# Patient Record
Sex: Female | Born: 1960 | Hispanic: Yes | Marital: Single | Smoking: Never smoker
Health system: Southern US, Community
[De-identification: ages and names within clinical notes are randomized; demographics above are authoritative.]

## PROBLEM LIST (undated history)

## (undated) DIAGNOSIS — K760 Fatty (change of) liver, not elsewhere classified: Secondary | ICD-10-CM

## (undated) HISTORY — PX: BREAST BIOPSY: SHX20

## (undated) HISTORY — PX: TUBAL LIGATION: SHX77

## (undated) HISTORY — PX: APPENDECTOMY: SHX54

## (undated) HISTORY — DX: Fatty (change of) liver, not elsewhere classified: K76.0

---

## 2016-09-24 ENCOUNTER — Ambulatory Visit (INDEPENDENT_AMBULATORY_CARE_PROVIDER_SITE_OTHER): Payer: Self-pay | Admitting: Family Medicine

## 2016-09-24 ENCOUNTER — Encounter: Payer: Self-pay | Admitting: Family Medicine

## 2016-09-24 DIAGNOSIS — N6012 Diffuse cystic mastopathy of left breast: Secondary | ICD-10-CM

## 2016-09-24 DIAGNOSIS — N6011 Diffuse cystic mastopathy of right breast: Secondary | ICD-10-CM

## 2016-09-24 NOTE — Patient Instructions (Signed)
It was a pleasure to see you today! Please call the [facility] to set up an appointment to get your [employer]. Their number is [phone]. I want to see you back in 1 month to recheck your blood pressure.

## 2016-09-24 NOTE — Progress Notes (Signed)
   CC: new patient  HPI: Hx of mammo, US, biopsy of breast in from DR started in 2014. Has US and mammo followed yearly there. Microcalcification on L breast. No PCP before in [place]. Told she had cysts and nodules bilaterally, but biopsies benign. Moved here in 2014. Tamoxifen - took from April and finished 90d later. Repeated evening primrose oil for another 3 months.   L breast pain: always hurts, not badly, mild discomfort. Tingling sensation. Thinks it could be menopause or the cysts. Tried moving her arm around, no meds, no heat or ice. Hx of left shoulder pain, ortho told her she had L bursitis, got 1 cortisone injection, has been doing ROM exercises and ice for this. Saw oncologist because of back tingling over bra strap, felt as if ants were walking in circles there. Cream applied, has improved.   Had EKG and blood work done in the DR due to shoulder tingling, some question of thyroid inflammation, given a medicine, pt unsure of medicine, not taking it now.  Health maintenance: last PAP 2017 normal; no hx of colonoscopy.  Social: lives with 56 yo daughter, 10614 yo grandson. No tobacco, EtOH, drugs. Sexually active.  PSHx: appy 1982 Menses: LMP last month, mild. Before that September, almost normal. No missed periods, just becoming lighter.   CC, SH/smoking status, and VS noted. Never smoker, no alcohol, no drugs.   Objective: BP (!) 145/85   Pulse 98   Temp 98.8 F (37.1 C) (Oral)   Ht 5' 2.5" (1.588 m)   Wt 165 lb 12.8 oz (75.2 kg)   BMI 29.84 kg/m  Gen: NAD, alert, cooperative, and pleasant. HEENT: NCAT, EOMI Breast: left - One pea-sized mobile mass at 6 o'clock, one pea-sized mobile mass at 3 o'clock. Mildly TTP over these masses.  Right - no masses palpated. No nipple discharge bilaterally. No overlying rashes.  CV: RRR, no murmur Resp: CTAB, no wheezes, non-labored Abd: SNTND, BS present, no guarding or organomegaly Ext: No edema, warm Neuro: Alert and oriented, Speech clear,  No gross deficits  Assessment and plan:  Bilateral fibrocystic breast disease Translated most recent ultrasound and mamm[place]. Both are consistent with nodules and cysts bilaterally BI-RADS Category 3. Recommended that the patient obtaining orange card appointment at the [facility], so that we can begin referring her for additional imaging.  Elevated blood pressure: Patient has never been treated for hypertension, daughter reports one check at home of systolic 148. Both initial and my recheck was 145/85. Will recheck at next visit and consider starting medication at that time. Counseled on lifestyle changes in the meantime.  Health maintenance: Never had a colonoscopy, once patient obtains orange card will help her set up this appointment. Will defer any screening labs until after she obtains orange card.  Meds ordered this encounter  Medications  . EVENING PRIMROSE OIL PO    Sig: Take by mouth.    Loni MuseKate Avory Mimbs, MD, PGY1 09/24/2016 4:45 PM

## 2016-09-24 NOTE — Assessment & Plan Note (Signed)
Translated most recent ultrasound and mammogram [place]. Both are consistent with nodules and cysts bilaterally BI-RADS Category 3. Recommended that the patient obtaining orange card appointment at the [facility], so that we can begin referring her for additional imaging.

## 2016-10-29 ENCOUNTER — Ambulatory Visit: Payer: Self-pay | Admitting: Family Medicine

## 2016-12-03 ENCOUNTER — Ambulatory Visit (INDEPENDENT_AMBULATORY_CARE_PROVIDER_SITE_OTHER): Payer: Self-pay | Admitting: Family Medicine

## 2016-12-03 DIAGNOSIS — R030 Elevated blood-pressure reading, without diagnosis of hypertension: Secondary | ICD-10-CM

## 2016-12-03 DIAGNOSIS — N6011 Diffuse cystic mastopathy of right breast: Secondary | ICD-10-CM

## 2016-12-03 DIAGNOSIS — N6012 Diffuse cystic mastopathy of left breast: Secondary | ICD-10-CM

## 2016-12-03 NOTE — Assessment & Plan Note (Signed)
On new patient appt, BP was 145/85. Counseled patient on lifestyle changes. She has since tried to eat more vegetables and fruit. BP on my manual recheck 135/80 today. Counseled patient that additional lifestyle changes could keep her from needing antihypertensives (her treatment threshold would be 140/90).

## 2016-12-03 NOTE — Progress Notes (Signed)
   CC: L breast pain  HPI  L breast - a little pain. No change since last time. No drainage, no warmth or redness over it. Feels a little bothered by this. No fevers, no weight loss. Tylenol helps some. Really wants more imaging of this area.   Regarding the orange card, patient was not able to obtain yet. [facility]   BP - no home checks, no change in exercise, more vegetables and fruits.   CC, SH/smoking status, and VS noted  Objective: BP 140/90 (BP Location: Left Arm, Patient Position: Sitting, Cuff Size: Normal)   Pulse 88   Temp 98.5 F (36.9 C) (Oral)   Ht 5' 2.5" (1.588 m)   Wt 164 lb 9.6 oz (74.7 kg)   SpO2 98%   BMI 29.63 kg/m  Gen: NAD, alert, cooperative, and pleasant. HEENT: NCAT, EOMI, PERRL Breast: R breast without palpable masses, drainage, warmth or erythema. L breast with almond-sized mobile dense mass at 6 o'clock without overlying retraction or skin changes, but mildly TTP. Otherwise L breast without drainage, warmth or erythema. CV: RRR, no murmur Resp: CTAB, no wheezes, non-labored Ext: No edema, warm Neuro: Alert and oriented, Speech clear, No gross deficits  Assessment and plan:  Bilateral fibrocystic breast disease Patient with persistent concern regarding some tenderness to L breast. No change in the pain from last appointment, no growth of the area, this is just concerning to the patient. I have personally spoken to the front desk staff and accelerated her orange card appointment for next Thursday at 10am. Called [facility] - orange card doesn't cover mammos, will need to apply for a scholarship, which takes about a week. Phone number for this is [phone]. I attempted to call this x 1. Will attempt again on Monday.   Elevated blood pressure reading On new patient appt, BP was 145/85. Counseled patient on lifestyle changes. She has since tried to eat more vegetables and fruit. BP on my manual recheck 135/80 today. Counseled patient that  additional lifestyle changes could keep her from needing antihypertensives (her treatment threshold would be 140/90).   Health maintenace: needs lab work and colonoscopy once orange card is obtained.   Loni MuseKate Timberlake, MD, PGY1 12/03/2016 4:15 PM

## 2016-12-03 NOTE — Patient Instructions (Signed)
It was nice to see you today! [facility] After that we will get you in for breast imaging.

## 2016-12-03 NOTE — Assessment & Plan Note (Addendum)
Patient with persistent concern regarding some tenderness to L breast. No change in the pain from last appointment, no growth of the area, this is just concerning to the patient. I have personally spoken to the front desk staff and accelerated her orange card appointment for next Thursday at 10am. Called [facility] - orange card doesn't cover mammos, will need to apply for a scholarship, which takes about a week. Phone number for this is [phone]. I attempted to call this x 1. Will attempt again on Monday.

## 2016-12-09 ENCOUNTER — Ambulatory Visit: Payer: Self-pay

## 2017-01-10 ENCOUNTER — Other Ambulatory Visit (HOSPITAL_COMMUNITY): Payer: Self-pay | Admitting: *Deleted

## 2017-01-10 DIAGNOSIS — N644 Mastodynia: Secondary | ICD-10-CM

## 2017-01-27 ENCOUNTER — Ambulatory Visit
Admission: RE | Admit: 2017-01-27 | Discharge: 2017-01-27 | Disposition: A | Discharge status: Home / Self Care | Payer: No Typology Code available for payment source | Source: Ambulatory Visit | Attending: Obstetrics and Gynecology | Admitting: Obstetrics and Gynecology

## 2017-01-27 ENCOUNTER — Encounter (HOSPITAL_COMMUNITY): Payer: Self-pay | Admitting: *Deleted

## 2017-01-27 ENCOUNTER — Ambulatory Visit (HOSPITAL_COMMUNITY)
Admission: RE | Admit: 2017-01-27 | Discharge: 2017-01-27 | Disposition: A | Discharge status: Home / Self Care | Payer: Self-pay | Source: Ambulatory Visit | Attending: Obstetrics and Gynecology | Admitting: Obstetrics and Gynecology

## 2017-01-27 VITALS — BP 118/68 | Temp 98.1°F | Ht 63.0 in | Wt 166.6 lb

## 2017-01-27 DIAGNOSIS — Z1239 Encounter for other screening for malignant neoplasm of breast: Secondary | ICD-10-CM

## 2017-01-27 DIAGNOSIS — N644 Mastodynia: Secondary | ICD-10-CM

## 2017-01-27 NOTE — Progress Notes (Signed)
Complaints of left breast lump x 3 years and pain since January 2018 that comes and goes. Patient rates the pain at a 8-9 out of 10.  Pap Smear: Pap smear not completed today. Last Pap smear was In March 2017 in the [place] and normal per patient. Per patient has no history of an abnormal Pap smear. No Pap smear results are in EPIC.  Physical exam: Breasts Breasts symmetrical. No skin abnormalities bilateral breasts. No nipple retraction bilateral breasts. No nipple discharge bilateral breasts. No lymphadenopathy. No lumps palpated bilateral breasts. No lumps palpated in patients area of concern. Complaints of diffuse breast pain left breast and right lower breast pain on exam. [facility]. Appointment scheduled for Thursday, January 27, 2017 at 1050.        Pelvic/Bimanual No Pap smear completed today since last Pap smear was in March 2017 per patient. Pap smear not indicated per BCCCP guidelines.   Smoking History: Patient has never smoked.  Patient Navigation: Patient education provided. [facility]. Spanish interpreter provided.  Colorectal Cancer Screening: Per patient has never had a colonoscopy completed. No complaints today. FIT Test given to patient to complete and return to B[facility]CP.  Used Spanish interpreter Earle GellMaria Mendoza from [facility].

## 2017-01-27 NOTE — Patient Instructions (Signed)
Explained breast self awareness with Natasha [name]. Patient did not need a Pap smear today due to last Pap smear was in  March 2017 per patient. Let her know B[facility]CP will cover Pap smears every 3 years unless has a history of abnormal Pap smears. [facility]. Appointment scheduled for Thursday, January 27, 2017 at 1050.  Natasha [name] verbalized understanding.  Arius Harnois, Kathaleen Maserhristine Poll, RN 11:01 AM

## 2017-01-28 ENCOUNTER — Encounter (HOSPITAL_COMMUNITY): Payer: Self-pay | Admitting: *Deleted

## 2017-02-02 ENCOUNTER — Other Ambulatory Visit: Payer: Self-pay

## 2017-02-02 DIAGNOSIS — Z0000 Encounter for general adult medical examination without abnormal findings: Secondary | ICD-10-CM

## 2017-02-04 ENCOUNTER — Ambulatory Visit: Payer: Self-pay

## 2017-02-04 ENCOUNTER — Other Ambulatory Visit (HOSPITAL_BASED_OUTPATIENT_CLINIC_OR_DEPARTMENT_OTHER): Payer: Self-pay

## 2017-02-04 VITALS — BP 112/68 | Ht 63.0 in | Wt 164.0 lb

## 2017-02-04 DIAGNOSIS — Z0000 Encounter for general adult medical examination without abnormal findings: Secondary | ICD-10-CM

## 2017-02-04 LAB — GLUCOSE (CC13): Glucose: 101 mg/dl (ref 70–140)

## 2017-02-04 NOTE — Progress Notes (Signed)
Wisewoman Initial Screening  [facility]  Clinical Measurement: HT: 5'3 WT: 164lb BP:110/70 BPx2:112/68  Fasting labs drawn today, will review with patient when they result.  Medical History: Patient has high cholesterol that she controls with her diet, she does not have HTN or diabetes.  Medication: Patient does not take any medications at this time.  Blood Pressure, Self Measurement: Not applicable  Nutrition: [name] eats about 3/4 cup of fruit daily, 1 cup of vegetables daily. She eats fish weekly. She eats more than 3 ounces of whole grains daily. She does drink sugary beverages but not more than 36 ounces weekly. She also watches her sodium intake.  Physical Activity: Patient walks for 40 minutes 3 times a week. She does not do any vigorous activity at this time.   Smoking Status: Patient does not smoke and does not ride in a vehicle with a smoker at this time.  Quality of Life: Patient has not had any mental/physical health days preventing her from performing her normal daily activities in the last 30 days.  Risk Reduction and Counseling: Patient has set a goal of losing 15 pounds in 2 months. She will increase her vigorous physical activity, maybe speed walking once or twice a week. She will also focus on portion control. We went over portion control sizes and examples.   Navigation: Will call [name] in a week with lab results, with her second health coaching session. She was given pamphlets and wisewoman gifts to help reach her goal. Phone number provided if she has any additional questions or concerns interm.

## 2017-02-05 LAB — LIPID PANEL
CHOL/HDL RATIO: 4.3 ratio (ref 0.0–4.4)
Cholesterol, Total: 215 mg/dL — ABNORMAL HIGH (ref 100–199)
HDL: 50 mg/dL (ref >39)
LDL Calculated: 137 mg/dL — ABNORMAL HIGH (ref 0–99)
Triglycerides: 140 mg/dL (ref 0–149)
VLDL Cholesterol Cal: 28 mg/dL (ref 5–40)

## 2017-02-05 LAB — HEMOGLOBIN A1C
Est. average glucose Bld gHb Est-mCnc: 131 mg/dL
HEMOGLOBIN A1C: 6.2 % — AB (ref 4.8–5.6)

## 2017-02-08 ENCOUNTER — Telehealth: Payer: Self-pay

## 2017-02-08 NOTE — Telephone Encounter (Addendum)
Health Coaching # 2  Initial Screening:02/04/17  Interpretor: Delorise RoyalsJulie Sowell  Labs: Cholesterol: 215 HDL:50 LDL:137 Triglycerides: 140 Glucose:101 A1C: 6.2 Patient informed cholesterol was slightly elevated and to consume a low fat high fiber diet. She was also informed her A1C was elevated and she should avoid concentrated sweets.   Goal: Patients goal was to lose 15 pounds in 2 months, she is doing this and increasing her activity   Navigation: Time spent with patient via phone: 10 Minutes, patient understands there will be third health coaching session in 2-3 weeks.

## 2017-02-16 ENCOUNTER — Telehealth (HOSPITAL_COMMUNITY): Payer: Self-pay

## 2017-02-16 NOTE — Telephone Encounter (Signed)
Health Coaching #3 Initial Screening:02/04/17  Interpretor:Julie Sowell  Goal: patients goal was to lose weight by increasing  more vigorous activity. She has been doing this and has lost a couple pounds. Encouraged pt to keep up the good work.   Navigation: Time spent with patient via phone: 10 Minutes, patient understands there will be a final follow up call in 4- 6 weeks.

## 2017-06-21 ENCOUNTER — Telehealth (HOSPITAL_COMMUNITY): Payer: Self-pay | Admitting: *Deleted

## 2017-06-21 ENCOUNTER — Other Ambulatory Visit: Payer: Self-pay | Admitting: Obstetrics and Gynecology

## 2017-06-21 DIAGNOSIS — R921 Mammographic calcification found on diagnostic imaging of breast: Secondary | ICD-10-CM

## 2017-06-21 NOTE — Telephone Encounter (Signed)
Called patient with Spanish interpreter Nira ConnJulia Sowell and gave patients [facility]. Let patient know her 109-month follow-up appointment is Monday, August 01, 2017 at 1300. Patient verbalized understanding.

## 2017-07-19 ENCOUNTER — Telehealth (HOSPITAL_COMMUNITY): Payer: Self-pay

## 2017-07-19 NOTE — Telephone Encounter (Signed)
Interpreter Apolonio SchneidersJulie Sow called and reminded patient about the at home FIT Test that was given to patient in [facility] on 01/27/17.

## 2017-08-01 ENCOUNTER — Ambulatory Visit
Admission: RE | Admit: 2017-08-01 | Discharge: 2017-08-01 | Disposition: A | Discharge status: Home / Self Care | Payer: No Typology Code available for payment source | Source: Ambulatory Visit | Attending: Obstetrics and Gynecology | Admitting: Obstetrics and Gynecology

## 2017-08-01 DIAGNOSIS — R921 Mammographic calcification found on diagnostic imaging of breast: Secondary | ICD-10-CM

## 2017-08-24 ENCOUNTER — Telehealth (HOSPITAL_COMMUNITY): Payer: Self-pay

## 2017-10-12 NOTE — Telephone Encounter (Signed)
Left message regarding WISEWOMAN program.

## 2017-10-14 ENCOUNTER — Telehealth (HOSPITAL_COMMUNITY): Payer: Self-pay

## 2017-10-14 NOTE — Telephone Encounter (Signed)
Phoned patient regarding WISEWOMAN follow up. Interpreter Julie Sowell left message. 

## 2018-01-09 ENCOUNTER — Other Ambulatory Visit (HOSPITAL_COMMUNITY): Payer: Self-pay | Admitting: *Deleted

## 2018-01-09 DIAGNOSIS — R921 Mammographic calcification found on diagnostic imaging of breast: Secondary | ICD-10-CM

## 2018-02-09 ENCOUNTER — Ambulatory Visit
Admission: RE | Admit: 2018-02-09 | Discharge: 2018-02-09 | Disposition: A | Discharge status: Home / Self Care | Payer: No Typology Code available for payment source | Source: Ambulatory Visit | Attending: Obstetrics and Gynecology | Admitting: Obstetrics and Gynecology

## 2018-02-09 ENCOUNTER — Ambulatory Visit (HOSPITAL_COMMUNITY)
Admission: RE | Admit: 2018-02-09 | Discharge: 2018-02-09 | Disposition: A | Discharge status: Home / Self Care | Payer: Self-pay | Source: Ambulatory Visit | Attending: Obstetrics and Gynecology | Admitting: Obstetrics and Gynecology

## 2018-02-09 ENCOUNTER — Encounter (HOSPITAL_COMMUNITY): Payer: Self-pay

## 2018-02-09 VITALS — BP 110/68 | Ht 63.0 in

## 2018-02-09 DIAGNOSIS — N644 Mastodynia: Secondary | ICD-10-CM

## 2018-02-09 DIAGNOSIS — Z1239 Encounter for other screening for malignant neoplasm of breast: Secondary | ICD-10-CM

## 2018-02-09 DIAGNOSIS — R921 Mammographic calcification found on diagnostic imaging of breast: Secondary | ICD-10-CM

## 2018-02-09 NOTE — Patient Instructions (Signed)
Explained breast self awareness with Natasha SergeantMarilyn [name]. Patient did not need a Pap smear today due to last Pap smear was in August 2018 per patient. Let her know BCCCP will cover Pap smears every 3 years unless has a history of abnormal Pap smears. [facility]. Appointment scheduled for Thursday, February 09, 2018 at 0850. Natasha [name] verbalized understanding.  [name], Natasha Maserhristine Poll, RN 8:42 AM

## 2018-02-09 NOTE — Progress Notes (Signed)
Complaints of left lower breast pain x 3 years that always occurs when touched. Patient rates the pain at a 4 out of 10.  Pap Smear: Pap smear not completed today. Last Pap smear was In August 208 in the [place] and normal per patient. Per patient has no history of an abnormal Pap smear. No Pap smear results are in EPIC.  Physical exam: Breasts Breasts symmetrical. No skin abnormalities bilateral breasts. No nipple retraction bilateral breasts. No nipple discharge bilateral breasts. No lymphadenopathy. No lumps palpated bilateral breasts. Complaints of bilateral lower and outer breast pain on exam. [facility]. Appointment scheduled for Thursday, February 09, 2018 at 0850.      Pelvic/Bimanual No Pap smear completed today since last Pap smear was in August 2018 per patient. Pap smear not indicated per BCCCP guidelines.   Smoking History: Patient has never smoked.  Patient Navigation: Patient education provided. Access to services provided for [facility]. Spanish interpreter provided.   Colorectal Cancer Screening: Per patient has never had a colonoscopy completed. No complaints today. FIT Test given to patient to complete and return to B[facility]CP.  Breast and Cervical Cancer Risk Assessment: Patient has no family history of breast cancer, known genetic mutations, or radiation treatment to the chest before age 89. Patient has no history of cervical dysplasia, immunocompromised, or DES exposure in-utero.  Used Spanish interpreter [employer]rika McReynolds from [employer].

## 2018-02-10 ENCOUNTER — Encounter (HOSPITAL_COMMUNITY): Payer: Self-pay | Admitting: *Deleted

## 2018-02-14 ENCOUNTER — Other Ambulatory Visit: Payer: Self-pay

## 2018-02-17 LAB — FECAL OCCULT BLOOD, IMMUNOCHEMICAL: FECAL OCCULT BLD: NEGATIVE

## 2018-02-21 ENCOUNTER — Encounter (HOSPITAL_COMMUNITY): Payer: Self-pay

## 2019-01-23 ENCOUNTER — Other Ambulatory Visit (HOSPITAL_COMMUNITY): Payer: Self-pay | Admitting: *Deleted

## 2019-01-23 DIAGNOSIS — R921 Mammographic calcification found on diagnostic imaging of breast: Secondary | ICD-10-CM

## 2019-01-27 ENCOUNTER — Emergency Department (HOSPITAL_COMMUNITY): Payer: No Typology Code available for payment source

## 2019-01-27 ENCOUNTER — Other Ambulatory Visit: Payer: Self-pay

## 2019-01-27 ENCOUNTER — Emergency Department (HOSPITAL_COMMUNITY)
Admission: EM | Admit: 2019-01-27 | Discharge: 2019-01-27 | Disposition: A | Discharge status: Home / Self Care | Payer: No Typology Code available for payment source | Attending: Emergency Medicine | Admitting: Emergency Medicine

## 2019-01-27 DIAGNOSIS — Y939 Activity, unspecified: Secondary | ICD-10-CM | POA: Insufficient documentation

## 2019-01-27 DIAGNOSIS — Y929 Unspecified place or not applicable: Secondary | ICD-10-CM | POA: Insufficient documentation

## 2019-01-27 DIAGNOSIS — W1841XA Slipping, tripping and stumbling without falling due to stepping on object, initial encounter: Secondary | ICD-10-CM | POA: Insufficient documentation

## 2019-01-27 DIAGNOSIS — Y999 Unspecified external cause status: Secondary | ICD-10-CM | POA: Insufficient documentation

## 2019-01-27 DIAGNOSIS — S92901A Unspecified fracture of right foot, initial encounter for closed fracture: Secondary | ICD-10-CM | POA: Insufficient documentation

## 2019-01-27 DIAGNOSIS — Z79899 Other long term (current) drug therapy: Secondary | ICD-10-CM | POA: Insufficient documentation

## 2019-01-27 MED ORDER — NAPROXEN 375 MG PO TABS: 375.0000 mg | ORAL_TABLET | Freq: Two times a day (BID) | ORAL | 0 refills | Status: DC

## 2019-01-27 MED ORDER — IBUPROFEN 400 MG PO TABS
600.0000 mg | ORAL_TABLET | Freq: Once | ORAL | Status: AC
  Administered 2019-01-27: 600 mg via ORAL
  Filled 2019-01-27: qty 1

## 2019-01-27 NOTE — ED Provider Notes (Signed)
[facility] EMERGENCY DEPARTMENT Provider Note   CSN: [id number] Arrival date & time: 01/27/19  1431    History   Chief Complaint Chief Complaint  Patient presents with   Foot Pain    HPI Natasha [name] is a 58 y.o. female who presents to the ED with c/o right foot/ankle pain. Patient reports she slipped on a water bottle that was in the floor causing her to fall and bend her foot and ankle. Patient denies head injury and is not take blood thinner.     HPI  No past medical history on file.  Patient Active Problem List   Diagnosis Date Noted   Elevated blood pressure reading 12/03/2016   Bilateral fibrocystic breast disease 09/24/2016    Past Surgical History:  Procedure Laterality Date   APPENDECTOMY     TUBAL LIGATION       OB History    Gravida  3   Para      Term      Preterm      AB      Living  3     SAB      TAB      Ectopic      Multiple      Live Births  3            Home Medications    Prior to Admission medications   Medication Sig Start Date End Date Taking? Authorizing Provider  EVENING PRIMROSE OIL PO Take by mouth.    [provider]  naproxen (NAPROSYN) 375 MG tablet Take 1 tablet (375 mg total) by mouth 2 (two) times daily. 01/27/19   Janne Napoleon, NP  vitamin E 100 UNIT capsule Take by mouth daily.    [provider]    Family History Family History  Problem Relation Age of Onset   Hypertension Mother    Stomach cancer Sister     Social History Social History   Tobacco Use   Smoking status: Never Smoker   Smokeless tobacco: Never Used  Substance Use Topics   Alcohol use: No   Drug use: No     Allergies   Patient has no known allergies.   Review of Systems Review of Systems  Musculoskeletal: Positive for arthralgias. Gait problem: due to injury.  All other systems reviewed and are negative.    Physical Exam Updated Vital Signs BP (!) 153/86 (BP  Location: Right Arm)    Pulse 65    Temp 97.9 F (36.6 C) (Oral)    Resp 16    LMP 01/15/2017 (Exact Date)    SpO2 100%   Physical Exam Vitals signs and nursing note reviewed.  Constitutional:      General: She is not in acute distress.    Appearance: She is well-developed.  HENT:     Head: Normocephalic.     Mouth/Throat:     Mouth: Mucous membranes are moist.  Eyes:     Conjunctiva/sclera: Conjunctivae normal.  Neck:     Musculoskeletal: Neck supple.  Cardiovascular:     Rate and Rhythm: Normal rate.  Pulmonary:     Effort: Pulmonary effort is normal.  Musculoskeletal:     Right foot: Normal capillary refill. Decreased range of motion: due to pain. Tenderness and swelling present. No crepitus, deformity or laceration.     Comments: Pedal pulse 2+, adequate circulation. Tender with palpation to the lateral aspect of the right foot and ankle. Ecchymosis noted.  Skin:    General: Skin is warm and dry.  Neurological:     Mental Status: She is alert and oriented to person, place, and time.      ED Treatments / Results  Labs (all labs ordered are listed, but only abnormal results are displayed) Labs Reviewed - No data to display  Radiology Dg Ankle Complete Right  Result Date: 01/27/2019 CLINICAL DATA:  Pain following fall EXAM: RIGHT ANKLE - COMPLETE 3+ VIEW COMPARISON:  None. FINDINGS: Frontal, oblique, and lateral views were obtained. There is no appreciable fracture or joint effusion. Joint spaces appear normal. No erosive change. Ankle mortise appears intact. There is a posterior calcaneal spur. IMPRESSION: No evident fracture or arthropathy. There is a posterior calcaneal spur. Ankle mortise appears intact. Electronically Signed   By: Bretta Bang III M.D.   On: 01/27/2019 15:52   Dg Foot Complete Right  Addendum Date: 01/27/2019   ADDENDUM REPORT: 01/27/2019 17:50 ADDENDUM: After reviewing the images with the clinician, there is question of a small avulsed fragment  between the base of the 2nd and 3rd metatarsals on the oblique view. Electronically Signed   By: Charlett Nose M.D.   On: 01/27/2019 17:50   Result Date: 01/27/2019 CLINICAL DATA:  Right ankle and foot pain, tripped and fall EXAM: RIGHT FOOT COMPLETE - 3+ VIEW COMPARISON:  None. FINDINGS: No acute bony abnormality. Specifically, no fracture, subluxation, or dislocation. Joint spaces maintained. Soft tissues unremarkable. Posterior calcaneal spur. IMPRESSION: No acute bony abnormality. Electronically Signed: By: Charlett Nose M.D. On: 01/27/2019 17:14    Procedures Procedures (including critical care time)  Medications Ordered in ED Medications  ibuprofen (ADVIL,MOTRIN) tablet 600 mg (600 mg Oral Given 01/27/19 1700)   After I discussed findings with the patient and her family they now tell me that they were at an ortho urgent care earlier and was told there was a foot fracture and to come to the ED for treatment. Dr. Clarice Pole and I reviewed the films and questioned a small fracture at the base of the second and third MT. I spoke with the radiologist and he will review the film and make addendum.  Initial Impression / Assessment and Plan / ED Course  I have reviewed the triage vital signs and the nursing notes.  58 y.o. female here with right foot pain, swelling and ecchymosis stable for d/c without focal neuro deficits. X-ray shows fracture. Patient placed in cam walker, crutches and pain managed. RICE protocol. Follow up with ortho. Return precautions discussed.   Final Clinical Impressions(s) / ED Diagnoses   Final diagnoses:  Foot fracture, right, closed, initial encounter    ED Discharge Orders         Ordered    naproxen (NAPROSYN) 375 MG tablet  2 times daily     01/27/19 1751           Damian Leavell [place], [place] 01/27/19 1804    Arby Barrette, MD 01/28/19 1212

## 2019-01-27 NOTE — ED Notes (Signed)
Patient transported to x-ray. ?

## 2019-01-27 NOTE — Discharge Instructions (Addendum)
Follow up with the orthopedic doctor. Return here as needed.  °

## 2019-01-27 NOTE — ED Provider Notes (Signed)
Natasha [name] is a 58 y.o.  Female here with ankle injury.  Went to exam room to see the patient and no one in room.   Dg Ankle Complete Right  Result Date: 01/27/2019 CLINICAL DATA:  Pain following fall EXAM: RIGHT ANKLE - COMPLETE 3+ VIEW COMPARISON:  None. FINDINGS: Frontal, oblique, and lateral views were obtained. There is no appreciable fracture or joint effusion. Joint spaces appear normal. No erosive change. Ankle mortise appears intact. There is a posterior calcaneal spur. IMPRESSION: No evident fracture or arthropathy. There is a posterior calcaneal spur. Ankle mortise appears intact. Electronically Signed   By: Bretta Bang III M.D.   On: 01/27/2019 15:52   RN will notify me if patient returns.   Kerrie Buffalo [place], [place] 01/27/19 1613    Arby Barrette, MD 01/28/19 1213

## 2019-01-27 NOTE — ED Triage Notes (Signed)
Pt here for right ankle/foot pain onset today when she tripped over a water bottle in the floor. Reports unable to weight bare. Reports when she fell she hit her head but did not pass out and is not on anticoagulation.

## 2019-03-15 ENCOUNTER — Ambulatory Visit (HOSPITAL_COMMUNITY): Payer: No Typology Code available for payment source

## 2019-05-08 ENCOUNTER — Ambulatory Visit (HOSPITAL_COMMUNITY)
Admission: RE | Admit: 2019-05-08 | Discharge: 2019-05-08 | Disposition: A | Discharge status: Home / Self Care | Payer: No Typology Code available for payment source | Source: Ambulatory Visit | Attending: Obstetrics and Gynecology | Admitting: Obstetrics and Gynecology

## 2019-05-08 ENCOUNTER — Encounter (HOSPITAL_COMMUNITY): Payer: Self-pay

## 2019-05-08 ENCOUNTER — Other Ambulatory Visit: Payer: Self-pay

## 2019-05-08 DIAGNOSIS — Z1239 Encounter for other screening for malignant neoplasm of breast: Secondary | ICD-10-CM

## 2019-05-08 DIAGNOSIS — N644 Mastodynia: Secondary | ICD-10-CM

## 2019-05-08 NOTE — Progress Notes (Signed)
Patient referred to [facility] by the [facility] due to recommending a one year follow-up diagnostic mammogram. Last bilateral diagnostic mammogram completed 02/09/2018.  Pap Smear: Pap smear not completed today. Last Pap smear was In August 2018 in the [place] and normal per patient. Per patient has no history of an abnormal Pap smear. No Pap smear results are in EPIC.  Physical exam: Breasts Breasts symmetrical. No skin abnormalities bilateral breasts. No nipple retraction bilateral breasts. No nipple discharge bilateral breasts. No lymphadenopathy. No lumps palpated bilateral breasts. Complaints of diffuse  left breast pain and right lower breast tenderness on exam. Referred patient to the [facility] for diagnostic mammogram. Appointment scheduled for Thursday, May 10, 2019 at 1520.   Pelvic/Bimanual No Pap smear completed today since last Pap smear was in August 2018 per patient. Pap smear not indicated per BCCCP guidelines.   Smoking History: Patient has never smoked.  Patient Navigation: Patient education provided. Access to services provided for patient through [facility]. Spanish interpreter provided.   Colorectal Cancer Screening: Per patient has never had a colonoscopy completed. Patient completed a FIT Test 02/14/2018 that was negative. No complaints today.   Breast and Cervical Cancer Risk Assessment: Patient has no family history of breast cancer, known genetic mutations, or radiation treatment to the chest before age 30. Patient has no history of cervical dysplasia, immunocompromised, or DES exposure in-utero.  Risk Assessment    Risk Scores      05/08/2019   Last edited by: Armond Hang, LPN   5-year risk: 1.2 %   Lifetime risk: 7 %         Used Spanish interpreter [name] from [place].

## 2019-05-08 NOTE — Patient Instructions (Signed)
Explained breast self awareness with Jasper Loser. Patient did not need a Pap smear today due to last Pap smear was in August 2018 per patient. Let her know [facility]CP will cover Pap smears every 3 years unless has a history of abnormal Pap smears. Referred patient to the [place] for diagnostic mammogram. Appointment scheduled for Thursday, May 10, 2019 at 1520. Patient aware of appointment and will be there.  Niralya Ohanian verbalized understanding.  Lanelle Lindo, Arvil Chaco, RN 1:13 PM

## 2019-05-09 ENCOUNTER — Encounter: Payer: Self-pay | Admitting: *Deleted

## 2019-05-10 ENCOUNTER — Ambulatory Visit
Admission: RE | Admit: 2019-05-10 | Discharge: 2019-05-10 | Disposition: A | Discharge status: Home / Self Care | Payer: No Typology Code available for payment source | Source: Ambulatory Visit | Attending: Obstetrics and Gynecology | Admitting: Obstetrics and Gynecology

## 2019-05-10 ENCOUNTER — Other Ambulatory Visit: Payer: Self-pay

## 2019-05-10 DIAGNOSIS — R921 Mammographic calcification found on diagnostic imaging of breast: Secondary | ICD-10-CM

## 2019-08-25 ENCOUNTER — Other Ambulatory Visit: Payer: Self-pay

## 2019-08-25 ENCOUNTER — Emergency Department (HOSPITAL_COMMUNITY)
Admission: EM | Admit: 2019-08-25 | Discharge: 2019-08-25 | Disposition: A | Discharge status: Home / Self Care | Payer: No Typology Code available for payment source | Attending: Emergency Medicine | Admitting: Emergency Medicine

## 2019-08-25 ENCOUNTER — Encounter (HOSPITAL_COMMUNITY): Payer: Self-pay

## 2019-08-25 DIAGNOSIS — M7918 Myalgia, other site: Secondary | ICD-10-CM | POA: Insufficient documentation

## 2019-08-25 DIAGNOSIS — Z79899 Other long term (current) drug therapy: Secondary | ICD-10-CM | POA: Insufficient documentation

## 2019-08-25 MED ORDER — METHOCARBAMOL 500 MG PO TABS: 500.0000 mg | ORAL_TABLET | Freq: Two times a day (BID) | ORAL | 0 refills | Status: DC

## 2019-08-25 MED ORDER — IBUPROFEN 400 MG PO TABS: 400.0000 mg | ORAL_TABLET | Freq: Four times a day (QID) | ORAL | 0 refills | Status: DC | PRN
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
## 2020-04-23 ENCOUNTER — Other Ambulatory Visit: Payer: Self-pay | Admitting: Obstetrics and Gynecology

## 2020-04-25 ENCOUNTER — Other Ambulatory Visit: Payer: Self-pay | Admitting: *Deleted

## 2020-04-25 DIAGNOSIS — Z1231 Encounter for screening mammogram for malignant neoplasm of breast: Secondary | ICD-10-CM

## 2020-05-13 ENCOUNTER — Other Ambulatory Visit: Payer: Self-pay

## 2020-05-13 ENCOUNTER — Ambulatory Visit
Admission: RE | Admit: 2020-05-13 | Discharge: 2020-05-13 | Disposition: A | Discharge status: Home / Self Care | Payer: Self-pay | Source: Ambulatory Visit | Attending: Obstetrics and Gynecology | Admitting: Obstetrics and Gynecology

## 2020-05-13 ENCOUNTER — Ambulatory Visit: Payer: Self-pay | Admitting: *Deleted

## 2020-05-13 VITALS — BP 140/87 | Temp 98.2°F | Wt 166.2 lb

## 2020-05-13 DIAGNOSIS — Z1231 Encounter for screening mammogram for malignant neoplasm of breast: Secondary | ICD-10-CM

## 2020-05-13 DIAGNOSIS — Z1239 Encounter for other screening for malignant neoplasm of breast: Secondary | ICD-10-CM

## 2020-05-13 NOTE — Progress Notes (Signed)
[name] is a 59 y.o. female who presents to [facility] clinic today with no complaints.    Pap Smear: Pap smear not completed today. Last Pap smear was in Mar 26, 2020 in the [place] and was normal. Per patient she has no history of an abnormal Pap smear. Last Pap smear result is not available in Epic but patient has brought in a copy of her results.   Physical exam: Breasts Breasts symmetrical. Breasts with bilateral dimpling under nipple in the 7 o'clock position on the right and the 5 o'clock position on the left. This appears to be a normal anatomical occurrence for the patient. No nipple retraction bilateral breasts. No nipple discharge bilateral breasts. No lymphadenopathy. No lumps palpated bilateral breasts. Patient complains of outer bilateral breast tenderness during breast exam.       Pelvic/Bimanual Pap is not indicated today.    Smoking History: Patient has never smoked.    Patient Navigation: Patient education provided. Access to services provided for patient through [facility] program. Laveda Norman, Spanish interpreter provided through [facility].  Colorectal Cancer Screening: Per patient she has had FIT testing on 02/14/2018 through [facility] and the results were negative. No complaints today.    Breast and Cervical Cancer Risk Assessment: Patient does not have family history of breast cancer, known genetic mutations, or radiation treatment to the chest before age 82. Patient does not have history of cervical dysplasia, immunocompromised, or DES exposure in-utero.  Risk Assessment    Risk Scores      05/13/2020 05/08/2019   Last edited by: Narda Rutherford, LPN Stoney Bang H, LPN   5-year risk: 1.3 % 1.2 %   Lifetime risk: 6.8 % 7 %          A: B[facility]CP exam without pap smear Complaints of bilateral breast tenderness on exam.  P: Referred patient to the [facility] for a screening mammogram. Appointment scheduled May 13, 2020  at 11:40am.  [name], RN, FNP student 05/13/2020 11:09 AM   Attestation of Supervision of Student:  I confirm that I have verified the information documented in the nurse practitioner student's note and that I have also personally reperformed the history, physical exam and all medical decision making activities.  I have verified that all services and findings are accurately documented in this student's note; and I agree with management and plan as outlined in the documentation. I have also made any necessary editorial changes.  Brannock, Kathaleen Maser, RN [facility] [employer], [facility] 05/13/2020 2:34 PM

## 2020-05-13 NOTE — Patient Instructions (Addendum)
Informed Natasha [name] about breast self awareness. Patient did not need a Pap smear today due to last Pap smear was Mar 26, 2020. Let her know [facility] will cover Pap smears every 3 years unless has a history of abnormal Pap smears. Referred patient to the [facility] for screening mammogram. Appointment scheduled for May 13, 2020 at 11:40am. Patient aware of appointment and will be there. Let patient know the Breast Center will follow up with her within the next couple weeks with results of mammogram by letter or phone. Natasha [name] verbalized understanding.  [name], RN, FNP student 11:15 AM

## 2021-05-04 ENCOUNTER — Other Ambulatory Visit: Payer: Self-pay | Admitting: Obstetrics and Gynecology

## 2021-05-04 DIAGNOSIS — Z1231 Encounter for screening mammogram for malignant neoplasm of breast: Secondary | ICD-10-CM

## 2021-06-16 ENCOUNTER — Encounter (INDEPENDENT_AMBULATORY_CARE_PROVIDER_SITE_OTHER): Payer: Self-pay

## 2021-06-16 ENCOUNTER — Ambulatory Visit: Payer: Self-pay | Admitting: *Deleted

## 2021-06-16 ENCOUNTER — Other Ambulatory Visit: Payer: Self-pay

## 2021-06-16 VITALS — BP 160/96 | Wt 157.1 lb

## 2021-06-16 DIAGNOSIS — Z1239 Encounter for other screening for malignant neoplasm of breast: Secondary | ICD-10-CM

## 2021-06-16 NOTE — Patient Instructions (Signed)
Explained breast self awareness with Natasha [name]. Patient did not need a Pap smear today due to last Pap smear was 03/26/2020. Let her know [facility] will cover Pap smears every 3 years unless has a history of abnormal Pap smears. Referred patient to the [facility] for a screening mammogram on mobile unit. Appointment scheduled Thursday, June 25, 2021 at 1350 at the [facility]. Patient aware of appointment and will be there. [name] verbalized understanding.  Hayle Parisi, Kathaleen Maser, RN 3:02 PM

## 2021-06-16 NOTE — Progress Notes (Signed)
[name] is a 60 y.o. female who presents to [facility] clinic today with complaint of left outer breast pain that comes and goes x 10 years. Patient rates the pain at a 3 out of 10. Diagnostic mammograms were completed in 2018 and 2019 to follow-up for breast pain. Last screening mammogram completed 05/15/2020 was negative. See below for mammogram results.   Pap Smear: Pap smear not completed today. Last Pap smear was 03/26/2020 at a clinic in the [place] and was normal. Per patient has no history of an abnormal Pap smear. Last Pap smear result is available in Epic.   Physical exam: Breasts Breasts symmetrical. No skin abnormalities bilateral breasts. No nipple retraction bilateral breasts. No nipple discharge bilateral breasts. No lymphadenopathy. No lumps palpated bilateral breasts. Complaints of bilateral lower breast tenderness on exam.  MM DIAG BREAST TOMO UNI RIGHT  Result Date: 08/01/2017 CLINICAL DATA:  60 year old patient presents for evaluation of the right breast for probably benign calcifications. EXAM: 2D DIGITAL DIAGNOSTIC UNILATERAL RIGHT MAMMOGRAM WITH CAD AND ADJUNCT TOMO COMPARISON:  January 27, 2017 performed at our institution. Additionally, the patient brings in mammogram images from the [place] from August 2018. ACR Breast Density Category c: The breast tissue is heterogeneously dense, which may obscure small masses. FINDINGS: No mass or architectural distortion is identified in the right breast. There are grouped and scattered calcifications throughout the right breast that appears stable. No new or suspicious microcalcifications appreciated. Mammographic images were processed with CAD. IMPRESSION: Stable appearance of the right breast. Stable probably benign diffuse calcifications. RECOMMENDATION: Bilateral diagnostic mammogram is recommended in March 2019 to complete a 1 year follow-up. I have discussed the findings and recommendations with the patient.  Results were also provided in writing at the conclusion of the visit. If applicable, a reminder letter will be sent to the patient regarding the next appointment. BI-RADS CATEGORY  3: Probably benign. Electronically Signed   By: Britta Mccreedy M.D.   On: 08/01/2017 14:15   MM DIAG BREAST TOMO BILATERAL  Result Date: 02/09/2018 CLINICAL DATA:  Twelve month follow-up of bilateral breast calcifications. EXAM: DIGITAL DIAGNOSTIC BILATERAL MAMMOGRAM WITH CAD AND TOMO COMPARISON:  Previous exam(s). ACR Breast Density Category c: The breast tissue is heterogeneously dense, which may obscure small masses. FINDINGS: The scattered groups of bilateral breast calcifications are stable and probably benign. No other interval changes. Mammographic images were processed with CAD. IMPRESSION: Stable probably benign breast calcifications RECOMMENDATION: Twelve month follow-up of the probably benign bilateral breast calcifications I have discussed the findings and recommendations with the patient. Results were also provided in writing at the conclusion of the visit. If applicable, a reminder letter will be sent to the patient regarding the next appointment. BI-RADS CATEGORY  3: Probably benign. Electronically Signed   By: Gerome Sam III M.D   On: 02/09/2018 09:23   MM DIAG BREAST TOMO BILATERAL  Result Date: 01/27/2017 CLINICAL DATA:  The patient is being followed for bilateral breast calcifications. Diffuse bilateral breast pain. EXAM: 2D DIGITAL DIAGNOSTIC BILATERAL MAMMOGRAM WITH CAD AND ADJUNCT TOMO COMPARISON:  Outside film screen images from 1 year ago are available. No magnified views are included in the provided images. More prior films are not available. ACR Breast Density Category c: The breast tissue is heterogeneously dense, which may obscure small masses. FINDINGS: The probably benign bilateral breast calcifications are similar in appearance. No suspicious abnormalities. Mammographic images were processed with  CAD. IMPRESSION: Probably benign bilateral breast calcifications. RECOMMENDATION: Six-month  follow-up diagnostic mammography of the probably benign right breast calcifications. I have discussed the findings and recommendations with the patient. Results were also provided in writing at the conclusion of the visit. If applicable, a reminder letter will be sent to the patient regarding the next appointment. BI-RADS CATEGORY  3: Probably benign. Electronically Signed   By: Gerome Sam III M.D   On: 01/27/2017 10:42   MS DIGITAL SCREENING TOMO BILATERAL  Result Date: 05/15/2020 CLINICAL DATA:  Screening. EXAM: DIGITAL SCREENING BILATERAL MAMMOGRAM WITH TOMO AND CAD COMPARISON:  Previous exam(s). ACR Breast Density Category c: The breast tissue is heterogeneously dense, which may obscure small masses. FINDINGS: There are no findings suspicious for malignancy. Images were processed with CAD. IMPRESSION: No mammographic evidence of malignancy. A result letter of this screening mammogram will be mailed directly to the patient. RECOMMENDATION: Screening mammogram in one year. (Code:SM-B-01Y) BI-RADS CATEGORY  1: Negative. Electronically Signed   By: Amie Portland M.D.   On: 05/15/2020 12:17   MS DIGITAL DIAG TOMO BILAT  Result Date: 05/10/2019 CLINICAL DATA:  Follow-up of probably benign bilateral calcifications. EXAM: DIGITAL DIAGNOSTIC BILATERAL MAMMOGRAM WITH CAD AND TOMO COMPARISON:  Previous exam(s). ACR Breast Density Category c: The breast tissue is heterogeneously dense, which may obscure small masses. FINDINGS: Mammographically, there are no suspicious masses, areas of architectural distortion or new clustered calcifications in either breast. There are bilateral stable clustered and scattered calcifications. Their stability from 2018 supports benign etiology. Mammographic images were processed with CAD. IMPRESSION: No mammographic evidence of malignancy in either breast. RECOMMENDATION: Screening mammogram  in one year.(Code:SM-B-01Y) I have discussed the findings and recommendations with the patient. Results were also provided in writing at the conclusion of the visit. If applicable, a reminder letter will be sent to the patient regarding the next appointment. BI-RADS CATEGORY  2: Benign. Electronically Signed   By: Ted Mcalpine M.D.   On: 05/10/2019 16:02        Pelvic/Bimanual Pap is not indicated today per BCCCP guidelines.   Smoking History: Patient has never smoked.   Patient Navigation: Patient education provided. Access to services provided for patient through [place] program. Spanish interpreter Natale Lay from [facility] provided.   Colorectal Cancer Screening: Per patient has never had colonoscopy completed. FIT Test completed 02/14/2018 that was negative. No complaints today.    Breast and Cervical Cancer Risk Assessment: Patient does not have family history of breast cancer, known genetic mutations, or radiation treatment to the chest before age 54. Patient does not have history of cervical dysplasia, immunocompromised, or DES exposure in-utero.  Risk Assessment     Risk Scores       06/16/2021 05/13/2020   Last edited by: [name], CMA McGill, Sherie Demetrius Charity, LPN   5-year risk: 1.3 % 1.3 %   Lifetime risk: 6.6 % 6.8 %            A: BCCCP exam without pap smear Complaint of left outer breast pain.  P: Referred patient to the [facility] for a screening mammogram on mobile unit. Appointment scheduled Thursday, June 25, 2021 at 1350 at the [facility].  Priscille Heidelberg, RN 06/16/2021 3:02 PM

## 2021-06-25 ENCOUNTER — Other Ambulatory Visit: Payer: Self-pay

## 2021-06-25 ENCOUNTER — Ambulatory Visit
Admission: RE | Admit: 2021-06-25 | Discharge: 2021-06-25 | Disposition: A | Discharge status: Home / Self Care | Payer: No Typology Code available for payment source | Source: Ambulatory Visit | Attending: Obstetrics and Gynecology | Admitting: Obstetrics and Gynecology

## 2021-06-25 DIAGNOSIS — Z1231 Encounter for screening mammogram for malignant neoplasm of breast: Secondary | ICD-10-CM

## 2022-05-24 ENCOUNTER — Other Ambulatory Visit: Payer: Self-pay

## 2022-05-24 DIAGNOSIS — Z1231 Encounter for screening mammogram for malignant neoplasm of breast: Secondary | ICD-10-CM

## 2022-07-13 ENCOUNTER — Ambulatory Visit
Admission: RE | Admit: 2022-07-13 | Discharge: 2022-07-13 | Disposition: A | Discharge status: Home / Self Care | Payer: No Typology Code available for payment source | Source: Ambulatory Visit | Attending: Obstetrics and Gynecology | Admitting: Obstetrics and Gynecology

## 2022-07-13 ENCOUNTER — Ambulatory Visit: Payer: Self-pay | Admitting: Hematology and Oncology

## 2022-07-13 VITALS — BP 136/80 | Wt 164.7 lb

## 2022-07-13 DIAGNOSIS — Z1239 Encounter for other screening for malignant neoplasm of breast: Secondary | ICD-10-CM

## 2022-07-13 DIAGNOSIS — Z1211 Encounter for screening for malignant neoplasm of colon: Secondary | ICD-10-CM

## 2022-07-13 DIAGNOSIS — Z1231 Encounter for screening mammogram for malignant neoplasm of breast: Secondary | ICD-10-CM

## 2022-07-13 NOTE — Progress Notes (Signed)
[name] is a 61 y.o. female who presents to [facility] clinic today with no complaints .    Pap Smear: Pap not smear completed today. Last Pap smear was 02/24/2021 at [facility] clinic and was normal. Per patient has no history of an abnormal Pap smear. Last Pap smear result is not available in Epic.   Physical exam: Breasts Breasts symmetrical. No skin abnormalities bilateral breasts. No nipple retraction bilateral breasts. No nipple discharge bilateral breasts. No lymphadenopathy. No lumps palpated bilateral breasts.       Pelvic/Bimanual Pap is not indicated today    Smoking History: Patient is a current every day smoker and was referred to quit line.    Patient Navigation: Patient education provided. Access to services provided for patient through [facility] program. No  interpreter provided. No transportation provided   Colorectal Cancer Screening: Per patient has never had colonoscopy completed No complaints today. FIT test given   Breast and Cervical Cancer Risk Assessment: Patient does not have family history of breast cancer, known genetic mutations, or radiation treatment to the chest before age 3. Patient does not have history of cervical dysplasia, immunocompromised, or DES exposure in-utero.  Risk Assessment     Risk Scores       07/13/2022 06/16/2021   Last edited by: Narda Rutherford, LPN [name], CMA   5-year risk: 1.3 % 1.3 %   Lifetime risk: 6.4 % 6.6 %            A: BCCCP exam without pap smear No complaints with benign exam.  P: Referred patient to the [facility] for a screening mammogram. Appointment scheduled 08/29 @ 3:20 pm.  Pascal Lux, NP 07/13/2022 2:52 PM

## 2022-07-17 LAB — FECAL OCCULT BLOOD, IMMUNOCHEMICAL: Fecal Occult Bld: NEGATIVE

## 2022-07-28 ENCOUNTER — Telehealth: Payer: Self-pay

## 2022-07-28 NOTE — Telephone Encounter (Signed)
07/26/2022- Via Delorise Royals, Spanish Interpreter, Patient informed negative FIT test results, verbalized understanding.

## 2022-08-14 ENCOUNTER — Emergency Department (HOSPITAL_COMMUNITY): Payer: No Typology Code available for payment source

## 2022-08-14 ENCOUNTER — Emergency Department (HOSPITAL_COMMUNITY)
Admission: EM | Admit: 2022-08-14 | Discharge: 2022-08-15 | Disposition: A | Discharge status: Home / Self Care | Payer: No Typology Code available for payment source | Attending: Emergency Medicine | Admitting: Emergency Medicine

## 2022-08-14 ENCOUNTER — Other Ambulatory Visit: Payer: Self-pay

## 2022-08-14 DIAGNOSIS — M79671 Pain in right foot: Secondary | ICD-10-CM | POA: Insufficient documentation

## 2022-08-14 DIAGNOSIS — R030 Elevated blood-pressure reading, without diagnosis of hypertension: Secondary | ICD-10-CM | POA: Insufficient documentation

## 2022-08-14 NOTE — ED Provider Triage Note (Signed)
Emergency Medicine Provider Triage Evaluation Note  Natasha [name] , a 61 y.o. female  was evaluated in triage.  Pt complains of right-sided foot pain.  Patient states she has history of a fracture of the right foot.  No new injury or trauma noted but she has severe pain in the midfoot region when putting weight on the right foot. Review of Systems  Positive: As above Negative: As above  Physical Exam  BP (!) 175/102 (BP Location: Left Arm)   Pulse 89   Temp 98.4 F (36.9 C) (Oral)   Resp 15   LMP 01/15/2017 (Exact Date)   SpO2 100%  Gen:   Awake, no distress   Resp:  Normal effort  MSK:   Moves extremities without difficulty  Other:    Medical Decision Making  Medically screening exam initiated at 8:56 PM.  Appropriate orders placed.  Zia Najera was informed that the remainder of the evaluation will be completed by another provider, this initial triage assessment does not replace that evaluation, and the importance of remaining in the ED until their evaluation is complete.     Dorothyann Peng, PA-C 08/14/22 2057
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
## 2022-08-15 MED ORDER — NAPROXEN 375 MG PO TABS: 375.0000 mg | ORAL_TABLET | Freq: Two times a day (BID) | ORAL | 0 refills | Status: DC

## 2022-08-15 MED ORDER — HYDROCODONE-ACETAMINOPHEN 5-325 MG PO TABS
1.0000 | ORAL_TABLET | Freq: Once | ORAL | Status: AC
  Administered 2022-08-15: 1 via ORAL
  Filled 2022-08-15: qty 1

## 2022-08-15 MED ORDER — HYDROCODONE-ACETAMINOPHEN 5-325 MG PO TABS: 1.0000 | ORAL_TABLET | Freq: Four times a day (QID) | ORAL | 0 refills | Status: DC | PRN

## 2022-08-15 NOTE — Discharge Instructions (Signed)
Your x-ray did not show any new injury or fracture.  You received a walking boot and dose of pain medication in the emergency room.  I have sent an anti-inflammatory and pain medication into the pharmacy for you.  For any concerning symptoms you can return to the emergency room.  Otherwise I have given you a follow-up with podiatrist (foot doctor).  Please schedule a follow-up appointment with them. Also your blood pressure was elevated throughout the time you are in the emergency room.  This could be due to the pain urine because of your foot.  I recommend you keep a blood pressure diary at home and follow-up with your primary care provider.  If you do not have a primary care provider have attached information for [facility].

## 2022-08-15 NOTE — ED Notes (Signed)
Pt requested for ace bandage to be applied with cam boot.   RN applied ace bandage and cam boot.

## 2022-08-15 NOTE — ED Provider Notes (Signed)
[facility] EMERGENCY DEPARTMENT Provider Note   CSN: [id number] Arrival date & time: 08/14/22  1931     History  Chief Complaint  Patient presents with   Foot Pain    Natasha [name] is a 61 y.o. female.  61 year old female presents with her husband today for evaluation of right foot pain.  She states 3 years ago she suffered a fracture in her right midfoot.  She has done relatively well after her injury healed until 1 week ago.  She denies any injury around 1 week ago when her pain started back up.  She states its in the same spot.  Makes it difficult for her to ambulate.  Has been taking Tylenol, and diclofenac gel without relief.  She states pain worsened last night prompting her to come into the emergency room.  The history is provided by the patient. A language interpreter was used.       Home Medications Prior to Admission medications   Medication Sig Start Date End Date Taking? Authorizing Provider  EVENING PRIMROSE OIL PO Take by mouth. Patient not taking: Reported on 06/16/2021    [provider]  ibuprofen (ADVIL) 400 MG tablet Take 1 tablet (400 mg total) by mouth every 6 (six) hours as needed. Patient not taking: Reported on 06/16/2021 08/25/19   Lacretia Leigh, MD  methocarbamol (ROBAXIN) 500 MG tablet Take 1 tablet (500 mg total) by mouth 2 (two) times daily. Patient not taking: Reported on 06/16/2021 08/25/19   Lacretia Leigh, MD  naproxen (NAPROSYN) 375 MG tablet Take 1 tablet (375 mg total) by mouth 2 (two) times daily. Patient not taking: No sig reported 01/27/19   Ashley Murrain, NP  vitamin E 100 UNIT capsule Take by mouth daily. Patient not taking: Reported on 06/16/2021    [provider]      Allergies    Patient has no known allergies.    Review of Systems   Review of Systems  Constitutional:  Negative for fever.  Musculoskeletal:  Positive for arthralgias and gait problem (Antalgic gait). Negative for joint swelling.   All other systems reviewed and are negative.   Physical Exam Updated Vital Signs BP (!) 170/110   Pulse 72   Temp 97.9 F (36.6 C) (Oral)   Resp 16   LMP 01/15/2017 (Exact Date)   SpO2 100%  Physical Exam Vitals and nursing note reviewed.  Constitutional:      General: She is not in acute distress.    Appearance: Normal appearance. She is not ill-appearing.  HENT:     Head: Normocephalic and atraumatic.     Nose: Nose normal.  Eyes:     Conjunctiva/sclera: Conjunctivae normal.  Cardiovascular:     Rate and Rhythm: Normal rate and regular rhythm.     Pulses: Normal pulses.  Pulmonary:     Effort: Pulmonary effort is normal. No respiratory distress.     Breath sounds: Normal breath sounds. No wheezing.  Musculoskeletal:        General: No deformity. Normal range of motion.     Right lower leg: No edema.     Left lower leg: No edema.     Comments: Patient without visible deformity, swelling of the right foot.  2+ DP pulse present bilaterally.  Brisk cap refill.  Full range of motion and all digits of bilateral feet, and ankles, and both without tenderness to palpation..  Tenderness to palpation present only over the midfoot.  No overlying erythema, bruising  noted.  No tenderness palpation present over the plantar aspect of foot.  Skin:    Findings: No rash.  Neurological:     Mental Status: She is alert.     ED Results / Procedures / Treatments   Labs (all labs ordered are listed, but only abnormal results are displayed) Labs Reviewed - No data to display  EKG None  Radiology DG Foot Complete Right  Result Date: 08/14/2022 CLINICAL DATA:  Right foot pain.  History of previous fracture. EXAM: RIGHT FOOT COMPLETE - 3+ VIEW COMPARISON:  01/27/2019. FINDINGS: There is no evidence of acute fracture or dislocation. Questionable bony density is noted between the base of the second and third metatarsals which is unchanged from 2020. Mild degenerative changes are noted in the  midfoot. Minimal calcaneal spurring. Enthesopathic changes are present at the insertion site of the Achilles tendon IMPRESSION: No acute osseous abnormality. Electronically Signed   By: Thornell Sartorius M.D.   On: 08/14/2022 21:37    Procedures Procedures    Medications Ordered in ED Medications  HYDROcodone-acetaminophen (NORCO/VICODIN) 5-325 MG per tablet 1 tablet (has no administration in time range)    ED Course/ Medical Decision Making/ A&P                           Medical Decision Making Risk Prescription drug management.   61 year old female presents with above-mentioned complaints.  Without findings concerning for skin infection.  X-ray does not demonstrate any new injury.  She denies any new trauma.  Will provide patient dose of pain medication, cam boot.  Will prescribe patient naproxen and Norco.  Will provide podiatry referral.  Return precautions discussed.  Elevated blood pressure in the emergency room.  Likely secondary to pain.  Discussed blood pressure diary and follow-up with PCP. Patient's blood pressure elevated throughout the emergency room stay.  Without signs or symptoms concerning for endorgan damage.  Discussed keeping blood pressure diary and scheduling a follow-up appointment with PCP to have this rechecked.   Final Clinical Impression(s) / ED Diagnoses Final diagnoses:  Foot pain, right  Elevated blood pressure reading    Rx / DC Orders ED Discharge Orders          Ordered    HYDROcodone-acetaminophen (NORCO/VICODIN) 5-325 MG tablet  Every 6 hours PRN        08/15/22 1025    naproxen (NAPROSYN) 375 MG tablet  2 times daily        08/15/22 1025              Marita Kansas, PA-C 08/15/22 1026    Melene Plan, DO 08/15/22 1133

## 2022-08-16 ENCOUNTER — Ambulatory Visit (INDEPENDENT_AMBULATORY_CARE_PROVIDER_SITE_OTHER): Payer: Self-pay | Admitting: Podiatry

## 2022-08-16 ENCOUNTER — Ambulatory Visit (INDEPENDENT_AMBULATORY_CARE_PROVIDER_SITE_OTHER): Payer: No Typology Code available for payment source

## 2022-08-16 DIAGNOSIS — M19071 Primary osteoarthritis, right ankle and foot: Secondary | ICD-10-CM

## 2022-08-16 DIAGNOSIS — M79671 Pain in right foot: Secondary | ICD-10-CM

## 2022-08-16 DIAGNOSIS — L600 Ingrowing nail: Secondary | ICD-10-CM

## 2022-08-16 MED ORDER — MELOXICAM 15 MG PO TABS: 15.0000 mg | ORAL_TABLET | Freq: Every day | ORAL | 0 refills | Status: AC | PRN

## 2022-08-16 NOTE — Progress Notes (Signed)
Subjective:   Patient ID: Natasha [name], female   DOB: 61 y.o.   MRN: [id number]   HPI Chief Complaint  Patient presents with   Foot Pain    Had a foot injury in 2020 and right foot was broken, when to the ER on 2 days ago,  rate of pain, top of the foot and bunion pain, TX: cam boot, pain meds, X-Rays done today     61 year old female presents the above concerns.  Pain started about a 1 week ago. The boot helps. She states that the pain came on gradually. She gets a little swelling.  She previously had a Lisfranc fracture in 2020 which was treated conservatively.  She also gets an ingrown toenail that she is interested in having to fix.  She is asking about the cost for this..  No swelling or redness but the area is tender at times.   Review of Systems  All other systems reviewed and are negative.  No past medical history on file.  Past Surgical History:  Procedure Laterality Date   APPENDECTOMY     BREAST BIOPSY Left    2013   TUBAL LIGATION       Current Outpatient Medications:    meloxicam (MOBIC) 15 MG tablet, Take 1 tablet (15 mg total) by mouth daily as needed for pain., Disp: 30 tablet, Rfl: 0   EVENING PRIMROSE OIL PO, Take by mouth. (Patient not taking: Reported on 06/16/2021), Disp: , Rfl:    HYDROcodone-acetaminophen (NORCO/VICODIN) 5-325 MG tablet, Take 1-2 tablets by mouth every 6 (six) hours as needed for severe pain., Disp: 10 tablet, Rfl: 0   ibuprofen (ADVIL) 400 MG tablet, Take 1 tablet (400 mg total) by mouth every 6 (six) hours as needed. (Patient not taking: Reported on 06/16/2021), Disp: 30 tablet, Rfl: 0   methocarbamol (ROBAXIN) 500 MG tablet, Take 1 tablet (500 mg total) by mouth 2 (two) times daily. (Patient not taking: Reported on 06/16/2021), Disp: 20 tablet, Rfl: 0   naproxen (NAPROSYN) 375 MG tablet, Take 1 tablet (375 mg total) by mouth 2 (two) times daily., Disp: 20 tablet, Rfl: 0   vitamin E 100 UNIT capsule, Take by mouth daily. (Patient not taking:  Reported on 06/16/2021), Disp: , Rfl:   No Known Allergies        Objective:  Physical Exam  General: AAO x3, NAD  Dermatological: Incurvation present to the hallux toenail without any edema, erythema, drainage or pus in the area is tender at times.  No open lesions.  Vascular: Dorsalis Pedis artery and Posterior Tibial artery pedal pulses are 2/4 bilateral with immedate capillary fill time. There is no pain with calf compression, swelling, warmth, erythema.   Neruologic: Grossly intact via light touch bilateral.   Musculoskeletal: Tenderness palpation of the dorsal aspect of the midfoot along the Lisfranc joint.  There is some minimal edema.  No erythema or warmth.  Flexor, extensor tendons appear to be intact.  No area pinpoint tenderness.  Muscular strength 5/5 in all groups tested bilateral.  Gait: Unassisted, Nonantalgic.       Assessment:   Right midfoot Lisfranc arthritis, ingrown toenail     Plan:  -Treatment options discussed including all alternatives, risks, and complications -Etiology of symptoms were discussed -X-rays obtained reviewed.  3 views of the right foot were obtained.  There is arthritic changes present Lisfranc joint.  Also compared to the prior x-rays in 2020 when she had a Lisfranc injury. -Discussed to continue the cam  boot for now.  Prescribe meloxicam to take as needed.  Discussed that she starts to feel better transition to regular shoe as tolerated but discussed wearing a stiffer soled shoe to avoid excessive pressure to the midfoot.  Consider steroid injection if needed as well. -We discussed the ingrown toenail procedure.  She would like to get a quote prior to doing this.  We will contact her with this.  Trula Slade DPM

## 2022-09-03 ENCOUNTER — Other Ambulatory Visit: Payer: Self-pay | Admitting: Podiatry

## 2022-09-03 DIAGNOSIS — M19071 Primary osteoarthritis, right ankle and foot: Secondary | ICD-10-CM

## 2023-06-05 IMAGING — MG MM DIGITAL SCREENING BILAT W/ TOMO AND CAD
8 series · 8 of 24 positions shown · non-contrast
Comparison: Previous exam(s).

CLINICAL DATA: Screening.

EXAM:
DIGITAL SCREENING BILATERAL MAMMOGRAM WITH TOMOSYNTHESIS AND CAD
TECHNIQUE: Bilateral screening digital craniocaudal and mediolateral oblique
mammograms were obtained. Bilateral screening digital breast
tomosynthesis was performed. The images were evaluated with
computer-aided detection.

[L MLO synth-2D]
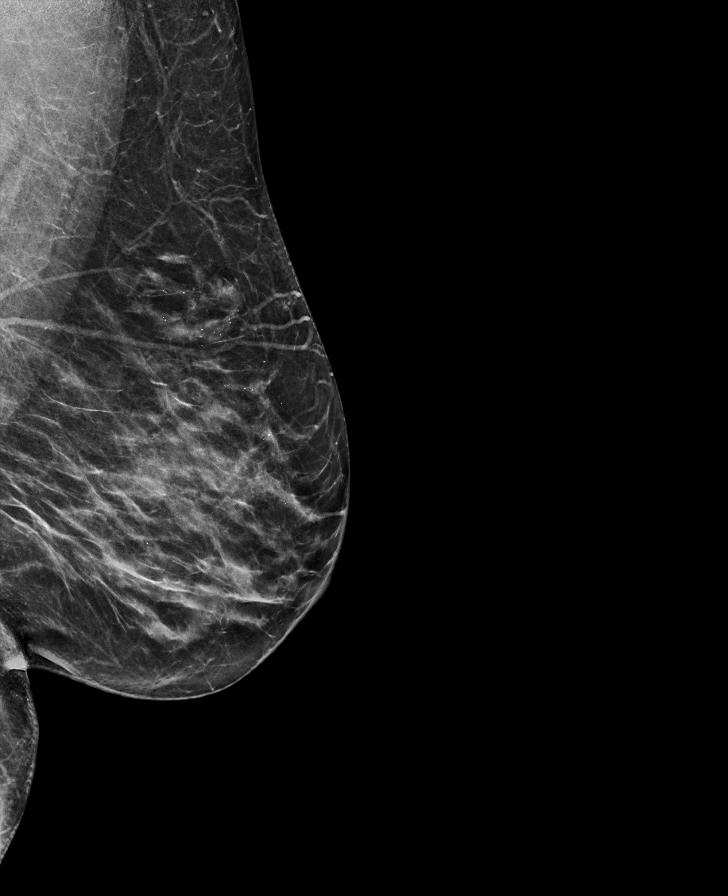

[R MLO synth-2D]
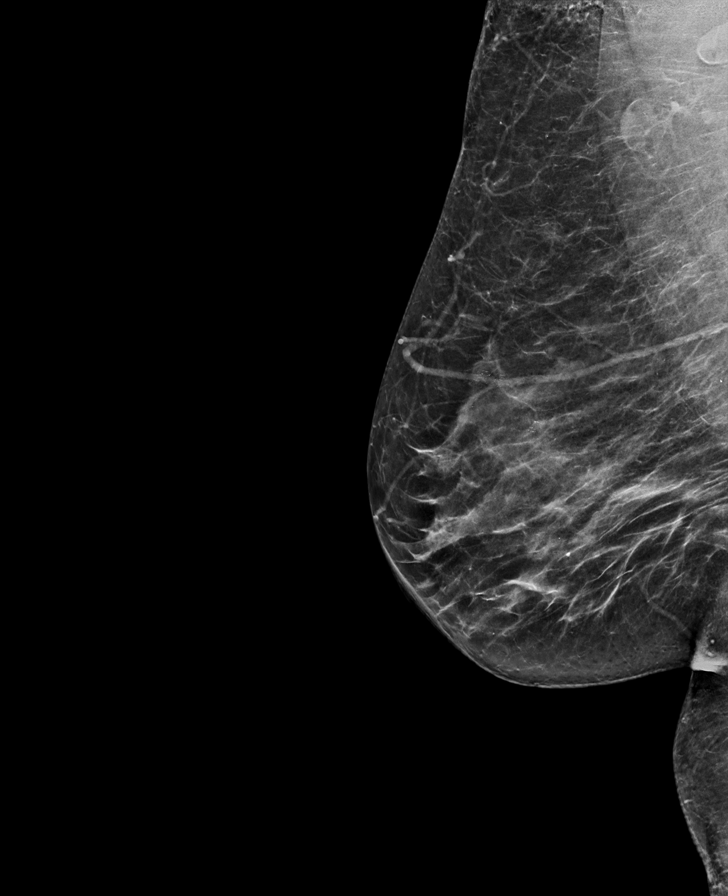

[R CC synth-2D]
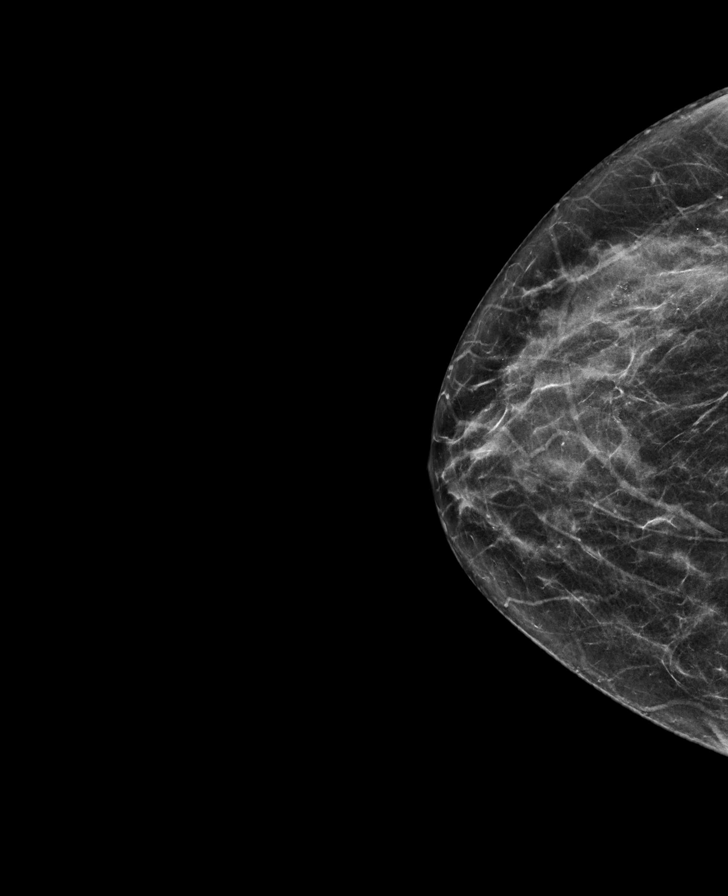

[L CC synth-2D]
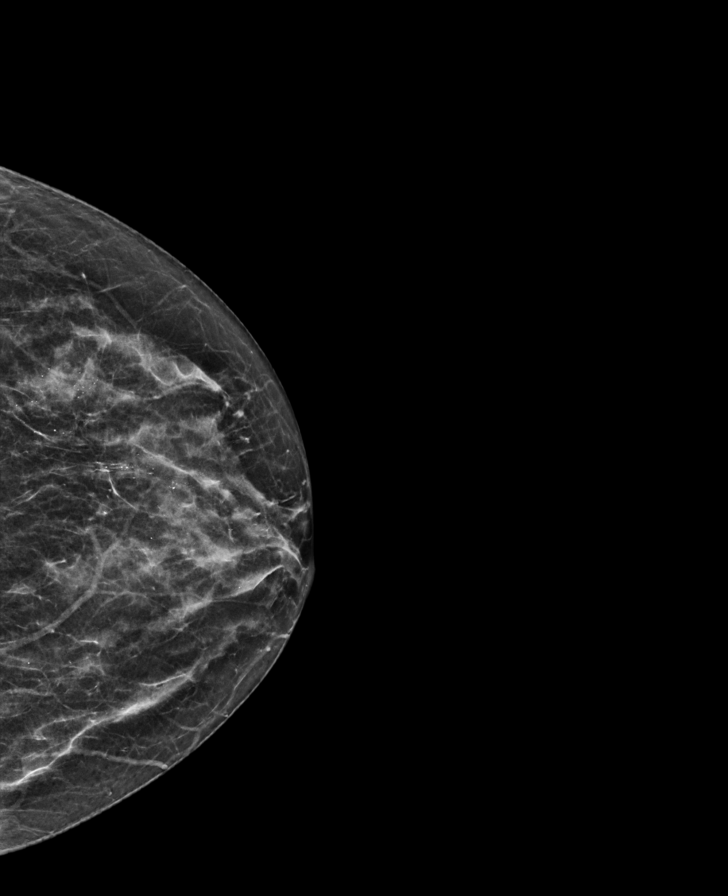

[R CC tomo · tomo slice 29/58.0]
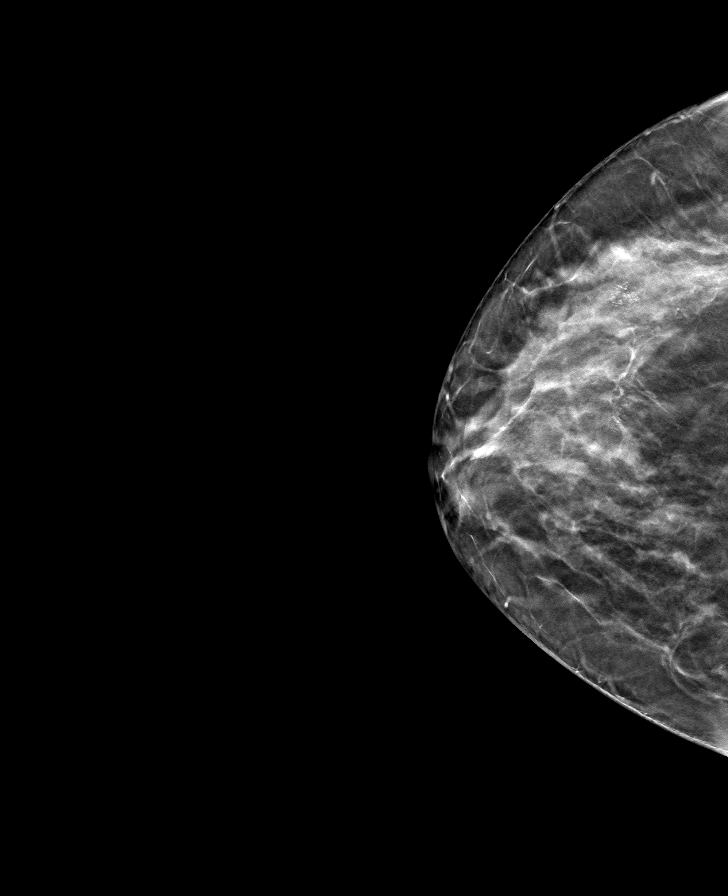

[L CC tomo · tomo slice 26/51.0]
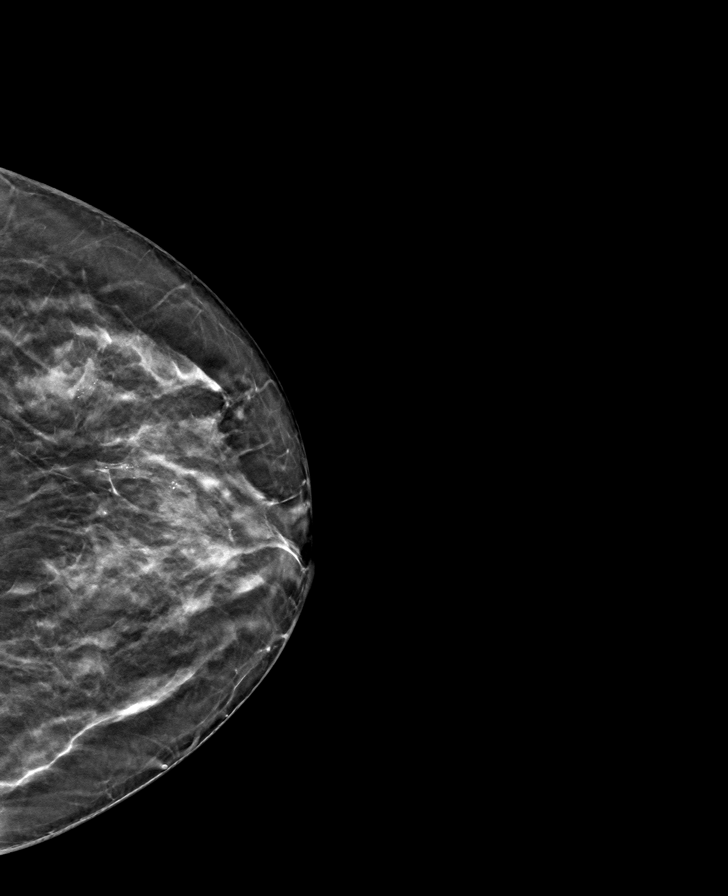

[R MLO tomo · tomo slice 31/62.0]
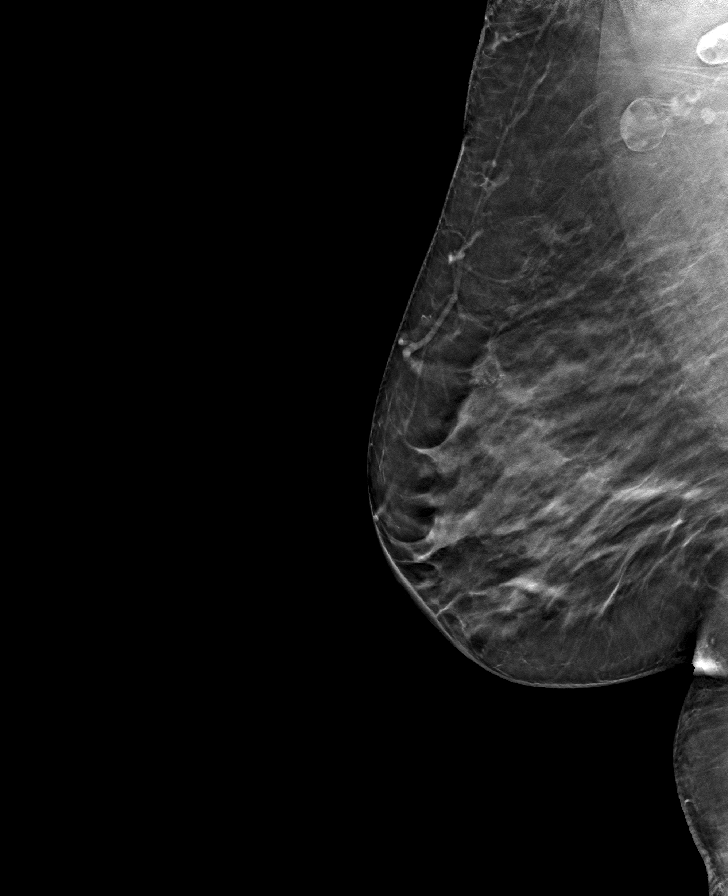

[L MLO tomo · tomo slice 31/62.0]
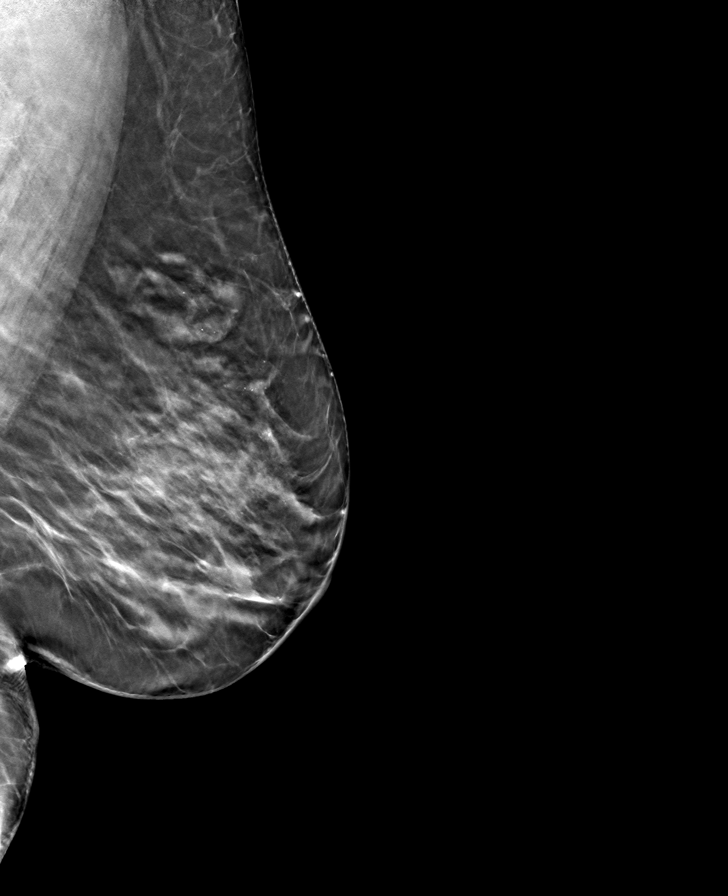

[8 of 24 positions shown; findings below may reference images not displayed]

ACR Breast Density Category c: The breast tissue is heterogeneously
dense, which may obscure small masses.
FINDINGS: There are no findings suspicious for malignancy.
IMPRESSION: No mammographic evidence of malignancy. A result letter of this
screening mammogram will be mailed directly to the patient.

RECOMMENDATION:
Screening mammogram in one year. (Code:Q3-W-BC3)

BI-RADS CATEGORY  1: Negative.

## 2023-08-04 ENCOUNTER — Other Ambulatory Visit: Payer: Self-pay | Admitting: Obstetrics and Gynecology

## 2023-08-04 DIAGNOSIS — Z1231 Encounter for screening mammogram for malignant neoplasm of breast: Secondary | ICD-10-CM

## 2023-08-09 DIAGNOSIS — Z1231 Encounter for screening mammogram for malignant neoplasm of breast: Secondary | ICD-10-CM

## 2023-10-06 ENCOUNTER — Ambulatory Visit: Payer: Self-pay | Admitting: Hematology and Oncology

## 2023-10-06 ENCOUNTER — Ambulatory Visit
Admission: RE | Admit: 2023-10-06 | Discharge: 2023-10-06 | Disposition: A | Discharge status: Home / Self Care | Payer: No Typology Code available for payment source | Source: Ambulatory Visit | Attending: Obstetrics and Gynecology | Admitting: Obstetrics and Gynecology

## 2023-10-06 VITALS — BP 177/102 | Wt 168.0 lb

## 2023-10-06 DIAGNOSIS — Z1231 Encounter for screening mammogram for malignant neoplasm of breast: Secondary | ICD-10-CM

## 2023-10-06 DIAGNOSIS — Z1211 Encounter for screening for malignant neoplasm of colon: Secondary | ICD-10-CM

## 2023-10-06 NOTE — Progress Notes (Signed)
Ms. Graylin Jalloh is a 62 y.o. female who presents to [facility] clinic today with no complaints.    Pap Smear: Pap not smear completed today. Last Pap smear was 03/26/2020 and was normal. Per patient has no history of an abnormal Pap smear. Last Pap smear result is available in Epic.   Physical exam: Breasts Breasts symmetrical. No skin abnormalities bilateral breasts. No nipple retraction bilateral breasts. No nipple discharge bilateral breasts. No lymphadenopathy. No lumps palpated bilateral breasts.       Pelvic/Bimanual Pap is not indicated today    Smoking History: Patient has never smoked and was not referred to quit line.    Patient Navigation: Patient education provided. Access to services provided for patient through B[facility]CP program. Natale Lay interpreter provided. No transportation provided   Colorectal Cancer Screening: Per patient has never had colonoscopy completed No complaints today. FIT test given.    Breast and Cervical Cancer Risk Assessment: Patient does not have family history of breast cancer, known genetic mutations, or radiation treatment to the chest before age 46. Patient does not have history of cervical dysplasia, immunocompromised, or DES exposure in-utero.  Risk Scores as of Encounter on 10/06/2023     Dondra Spry           5-year 1.66%   Lifetime 7.08%   This patient is [place] but has no documented birth country, so the [place] model used data from [place] patients to calculate their risk score. Document a birth country in the Demographics activity for a more accurate score.         Last calculated by Caprice Red, CMA on 10/06/2023 at  2:52 PM          A: B[facility]CP exam without pap smear No complaints with benign exam.   P: Referred patient to the [facility] for a screening mammogram. Appointment scheduled 10/06/2023  Ilda Basset, FNP- [facility]

## 2023-10-06 NOTE — Patient Instructions (Signed)
Taught Natasha [name] about self breast awareness and gave educational materials to take home. Patient did not need a Pap smear today due to last Pap smear was in 03/26/2020 per patient. Let her know B[facility]CP will cover Pap smears every 5 years unless has a history of abnormal Pap smears. Referred patient to the [facility] for screening mammogram. Appointment scheduled for 10/06/2023. Patient aware of appointment and will be there. Let patient know will follow up with her within the next couple weeks with results. Natasha [name] verbalized understanding.  Ilda Basset, FNP- [facility] 2:51 PM

## 2023-10-12 LAB — FECAL OCCULT BLOOD, IMMUNOCHEMICAL: Fecal Occult Bld: NEGATIVE

## 2023-10-28 ENCOUNTER — Other Ambulatory Visit: Payer: Self-pay

## 2023-10-28 ENCOUNTER — Inpatient Hospital Stay: Payer: No Typology Code available for payment source | Attending: Obstetrics and Gynecology | Admitting: *Deleted

## 2023-10-28 VITALS — BP 160/105 | Ht 61.0 in | Wt 162.3 lb

## 2023-10-28 DIAGNOSIS — Z0000 Encounter for general adult medical examination without abnormal findings: Secondary | ICD-10-CM

## 2023-10-28 NOTE — Progress Notes (Signed)
[facility] initial screening   Interpreter- Natale Lay, [place]   Clinical Measurement:  Vitals:   10/28/23 0905 10/28/23 1134  BP: (!) 168/100 (!) 160/105   Fasting Labs Drawn Today, will review with patient when they result.   Medical History: Patient states that she has high cholesterol, does not have high blood pressure and she does not have diabetes. Patient states that she does not have a history of gestational hypertension, gestational diabetes or pre-eclampsia/eclampsia.  Medications: Patient states that she does not take medication to lower cholesterol, blood pressure or blood sugar.  Patient does not take an aspirin a day to help prevent a heart attack or stroke.    Blood pressure, self measurement: Patient states that she does not have the equipment to measure blood pressure from home. She checks her blood pressure N/A. She shares her readings with a health care provider: N/A.   Nutrition: Patient states that on average she eats 3 cups of fruit and 2 cups of vegetables per day. Patient states that she does eat fish at least 2 times per week. Patient eats more than half servings of whole grains. Patient drinks less than 36 ounces of beverages with added sugar weekly: yes. Patient is currently watching sodium or salt intake: yes. In the past 7 days patient has consumed drinks containing alcohol on 0 days. On a day that patient consumes drinks containing alcohol on average 0 drinks are consumed.      Physical activity: Patient states that she gets 85 minutes of moderate and 85 minutes of vigorous physical activity each week.  Smoking status: Patient states that she has has never smoked .   Quality of life: Over the past 2 weeks patient states that she had little interest or pleasure in doing things: several days. She has been feeling down, depressed or hopeless:several days.   Social Determinants of Health Assessment:   Computer Use: During the last 12 months patient states that  she has used any of the following: desktop/laptop, smart phone or tablet/other portable wireless computer: yes.   Internet Use: During the last 12 months, did you or any member of your household have access to the internet: Yes, by paying a cell phone company or internet service provider.   Food Insecurities: During the last 12 months, where there any times when you were worried that you would run out of food because of a lack of money or other resources: No.   Transportation Barriers: During the last 12 months, have you missed a doctor's appointment because of transportation problems: No.   Childcare Barriers: If you are currently using childcare services, please identify  the type of services you use. (If not using childcare services, please select "Not applicable"): not applicable. During the last 12 months, have you had any barriers to childcare services such as: not applicable.   Housing: What is your housing situation today: I have housing.   Intimate Partner Violence: During the last 12 months, how often did your partner physically hurt you: never. During the last 12 months, how often did your partner insult you or talk down to you: never.  Medication Adherence: During the last 12 months, did you ever forget to take your medicine: No. During the last 12 months, were you careless ar times about taking your medicine: No. During the last 12 months, when you felt better did you sometimes stop taking your medication: No. During the last 12 months, sometimes if you felt worse when you took your medicine  did you stop taking it: No.   Risk reduction and counseling:   Health Coaching: Spoke to patient about the daily recommendations for fruits and vegetables. Showed patient what a serving size would look like. Patient consumes two servings of salmon weekly. Patient consumes a variety of whole grains including: quinoa, whole wheat bread and oatmeal. Patient has not consumed sodas in several years.  Patient watches her sodium intake. Patient has recently made changes with her diet in hopes of losing weight. She states that she has been doing exercise videos from home daily for at least 25 minutes. Patient seems motivated to make changes.   Heart Wise: Spoke with patient about the Heart Wise program. Enrolled patient. Showed patient how to use the monitor and then had her practice checking BP herself. Showed patient how to log her readings on tracking form. Gave patient educational materials about hypertension. Answered any questions that patient had.  Goal: Patient would like to loose 20 pounds over the next several months (specific time not set by patient) through making changes with diet and exercise.    Navigation:  I will notify patient of lab results.  Patient is aware of 2 more health coaching sessions and a follow up. Will refer patient to Internal Medicine for FU for elevated BP.  Time: 45 minutes

## 2023-10-29 LAB — HEMOGLOBIN A1C
Est. average glucose Bld gHb Est-mCnc: 143 mg/dL
Hgb A1c MFr Bld: 6.6 % — ABNORMAL HIGH (ref 4.8–5.6)

## 2023-10-29 LAB — LIPID PANEL
Chol/HDL Ratio: 4.2 {ratio} (ref 0.0–4.4)
Cholesterol, Total: 233 mg/dL — ABNORMAL HIGH (ref 100–199)
HDL: 56 mg/dL (ref >39)
LDL Chol Calc (NIH): 163 mg/dL — ABNORMAL HIGH (ref 0–99)
Triglycerides: 81 mg/dL (ref 0–149)
VLDL Cholesterol Cal: 14 mg/dL (ref 5–40)

## 2023-10-29 LAB — GLUCOSE, RANDOM: Glucose: 112 mg/dL — ABNORMAL HIGH (ref 70–99)

## 2023-10-31 ENCOUNTER — Telehealth: Payer: Self-pay

## 2023-10-31 NOTE — Telephone Encounter (Signed)
Left message for patient via [name], [name] about lab results from [facility]. Left name and number for patient to call back.

## 2023-11-01 ENCOUNTER — Telehealth: Payer: Self-pay

## 2023-11-01 NOTE — Telephone Encounter (Signed)
Health coaching 2   interpreter- Natale Lay, [employer]   Labs- 233 cholesterol, 163 LDL cholesterol, 81 triglycerides, 56 HDL cholesterol, 6.6 hemoglobin A1C, 112 mean plasma glucose. Patient understands and is aware of her lab results.   Goals-  1. Practice heart healthy diet (DASH). Reduce the amount of fried and fatty foods consumed. Try to grill, bake, broil or sautee foods instead. Reduce the amount of red meats consumed. Reduce the amount of high-fat foods consumed, look for low-fat or reduced-fat options. 2. Reduce the amount of sweet and sugary foods consumed. 3. Reduce the amount of carbohydrate rich foods consumed. Look for heart healthy whole grain or whole wheat options instead. 4. Daily exercise for 20-30 minutes  Checked in with patient about blood pressure monitoring. Patient states that she has been recording her daily readings and has no questions.   Navigation:  Patient is aware of 1 more health coaching sessions and a follow up. Patient is scheduled for follow-up Internal Medicine on November 18, 2023.  Time- 15 minutes

## 2023-11-18 ENCOUNTER — Encounter: Payer: Self-pay | Admitting: Internal Medicine

## 2023-11-18 ENCOUNTER — Ambulatory Visit (INDEPENDENT_AMBULATORY_CARE_PROVIDER_SITE_OTHER): Payer: Self-pay | Admitting: Internal Medicine

## 2023-11-18 VITALS — BP 139/91 | HR 97 | Temp 97.8°F | Ht 61.0 in | Wt 160.7 lb

## 2023-11-18 DIAGNOSIS — R030 Elevated blood-pressure reading, without diagnosis of hypertension: Secondary | ICD-10-CM | POA: Diagnosis not present

## 2023-11-18 DIAGNOSIS — E119 Type 2 diabetes mellitus without complications: Secondary | ICD-10-CM

## 2023-11-18 DIAGNOSIS — E785 Hyperlipidemia, unspecified: Secondary | ICD-10-CM

## 2023-11-18 NOTE — Assessment & Plan Note (Signed)
 HbA1c 11/01/2023 6.6% with mean glucose of 112. This is disconcordant and not consistent with definitive diagnosis of diabetes. She explains that her mother passed away in 2024-06-30 and from that time until a few weeks ago, her diet was very poor and heavy in fatty foods, and she was not exercising much at all. She is not on outpatient therapies for diabetes. She is motivated to continue with diet and exercise changes (1 hour of walking, 20 minutes of weight lifting, and 20 minutes of stretching every day) she has made in the last few weeks to improve her glycemic control prior to starting medications. Plan:F/u in 3 months. Continue newly-started diet and exercise modifications. If HbA1c is again elevated, would consider starting metformin or other agent for diabetes.

## 2023-11-18 NOTE — Progress Notes (Signed)
 CC: HTN f/u  HPI:  Ms.Natasha [name] is a 63 y.o. female with past medical history as detailed below who presents for HTN f/u after recent wise woman exam. Please see problem based charting for detailed assessment and plan.  Past Medical History: Past Medical History:  Diagnosis Date   Fatty liver    Past Surgical History: Past Surgical History:  Procedure Laterality Date   APPENDECTOMY     BREAST BIOPSY Left    2013   TUBAL LIGATION     Medications: Vitamin E supplement Vitamin D + K2 drops  Family History: Family History  Problem Relation Age of Onset   Hypertension Mother    Stomach cancer Sister    Ovarian cancer Sister    Breast cancer Neg Hx    Social History: Social History   Socioeconomic History   Marital status: Single    Spouse name: Not on file   Number of children: 3   Years of education: Not on file   Highest education level: Master's degree (e.g., MA, MS, MEng, MEd, MSW, MBA)  Occupational History   Not on file  Tobacco Use   Smoking status: Never   Smokeless tobacco: Never  Vaping Use   Vaping status: Never Used  Substance and Sexual Activity   Alcohol use: No   Drug use: No   Sexual activity: Yes    Birth control/protection: None  Other Topics Concern   Not on file  Social History Narrative   Lawyer in [place]   Social Drivers of Health   Financial Resource Strain: Not on file  Food Insecurity: No Food Insecurity (10/06/2023)   Hunger Vital Sign    Worried About Running Out of Food in the Last Year: Never true    Ran Out of Food in the Last Year: Never true  Transportation Needs: No Transportation Needs (10/06/2023)   PRAPARE - Administrator, Civil Service (Medical): No    Lack of Transportation (Non-Medical): No  Physical Activity: Inactive (05/08/2019)   Exercise Vital Sign    Days of Exercise per Week: 0 days    Minutes of Exercise per Session: 0 min  Stress: No Stress Concern Present (05/08/2019)    [employer] of Occupational Health - Occupational Stress Questionnaire    Feeling of Stress : Not at all  Social Connections: Moderately Integrated (05/08/2019)   Social Connection and Isolation Panel [NHANES]    Frequency of Communication with Friends and Family: More than three times a week    Frequency of Social Gatherings with Friends and Family: Once a week    Attends Religious Services: More than 4 times per year    Active Member of [employer] or Organizations: No    Attends Banker Meetings: Never    Marital Status: Living with partner  Intimate Partner Violence: Not At Risk (05/08/2019)   Humiliation, Afraid, Rape, and Kick questionnaire    Fear of Current or Ex-Partner: No    Emotionally Abused: No    Physically Abused: No    Sexually Abused: No   Review of Systems:  Negative unless otherwise stated.  Physical Exam:  Vitals:   11/18/23 0926  BP: (!) 169/90  Pulse: (!) 111  Temp: 97.8 F (36.6 C)  TempSrc: Oral  SpO2: 99%  Weight: 160 lb 11.2 oz (72.9 kg)  Height: 5' 1 (1.549 m)   Physical Exam Constitutional:      General: She is not in acute distress.  Appearance: Normal appearance.  Cardiovascular:     Rate and Rhythm: Normal rate and regular rhythm.  Pulmonary:     Effort: Pulmonary effort is normal.     Breath sounds: Normal breath sounds.  Abdominal:     General: Abdomen is flat.     Palpations: Abdomen is soft.     Tenderness: There is no abdominal tenderness.  Musculoskeletal:     Right lower leg: No edema.     Left lower leg: No edema.  Skin:    General: Skin is warm and dry.  Neurological:     General: No focal deficit present.     Mental Status: She is alert.  Psychiatric:        Mood and Affect: Mood normal.        Behavior: Behavior normal.    Assessment & Plan:   See Encounters Tab for problem based charting.  Elevated blood pressure reading BP 169/90, on repeat 139/91. Has charted history of elevated BP without  diagnosis of HTN, now with various visits with HTN. She checks her BP at home, values are consistently SBP 110s-low 130s, DBP upper 60s-80s. Not on OP therapies. BMP not checked recently. Plan:BMP today. F/u in 1 month for nursing visit for recheck of BP, bring BP cuff and log to appointment. If consistently elevated, would consider starting ACEi/ARB.  Type 2 diabetes mellitus without complication, without long-term current use of insulin (HCC) HbA1c 11/01/2023 6.6% with mean glucose of 112. This is disconcordant and not consistent with definitive diagnosis of diabetes. She explains that her mother passed away in 2024/07/10 and from that time until a few weeks ago, her diet was very poor and heavy in fatty foods, and she was not exercising much at all. She is not on outpatient therapies for diabetes. She is motivated to continue with diet and exercise changes (1 hour of walking, 20 minutes of weight lifting, and 20 minutes of stretching every day) she has made in the last few weeks to improve her glycemic control prior to starting medications. Plan:F/u in 3 months. Continue newly-started diet and exercise modifications. If HbA1c is again elevated, would consider starting metformin or other agent for diabetes.  Hyperlipidemia Lipid panel collected by oncology office 11/01/2023 positive for LDL 163, HDL 56, total cholesterol 766. She is not on statin therapy. ASCVD risk 8.4%. She is motivated to continue with diet and exercise changes (1 hour of walking, 20 minutes of weight lifting, and 20 minutes of stretching every day) she has made in the last few weeks to improve her cholesterol prior to starting medications. Plan:F/u in 3 months. Continue newly-started diet and exercise modifications. If consistently elevated LDL, or confirmed diagnosis of diabetes, plan to start a statin.  Patient discussed with Dr.  Karna

## 2023-11-18 NOTE — Assessment & Plan Note (Signed)
 BP 169/90, on repeat 139/91. Has charted history of elevated BP without diagnosis of HTN, now with various visits with HTN. She checks her BP at home, values are consistently SBP 110s-low 130s, DBP upper 60s-80s. Not on OP therapies. BMP not checked recently. Plan:BMP today. F/u in 1 month for nursing visit for recheck of BP, bring BP cuff and log to appointment. If consistently elevated, would consider starting ACEi/ARB.

## 2023-11-18 NOTE — Addendum Note (Signed)
 Addended by: Ihor Dow on: 11/18/2023 10:07 AM   Modules accepted: Orders

## 2023-11-18 NOTE — Assessment & Plan Note (Signed)
 Lipid panel collected by oncology office 11/01/2023 positive for LDL 163, HDL 56, total cholesterol 766. She is not on statin therapy. ASCVD risk 8.4%. She is motivated to continue with diet and exercise changes (1 hour of walking, 20 minutes of weight lifting, and 20 minutes of stretching every day) she has made in the last few weeks to improve her cholesterol prior to starting medications. Plan:F/u in 3 months. Continue newly-started diet and exercise modifications. If consistently elevated LDL, or confirmed diagnosis of diabetes, plan to start a statin.

## 2023-11-18 NOTE — Patient Instructions (Signed)
  Fue un placer atenderte hoy!  Le har saber si los resultados de su laboratorio son anormales.  Vuelva para un control de la presin arterial en 1 mes.  De lo contrario, nos gustara hacer un seguimiento en 3 meses para controlar sus laboratorios de colesterol y diabetes.  Le enviar una referencia para que se haga su prueba de [place]apan[place].  Mi mejor, Dr. Addie

## 2023-11-19 LAB — BMP8+ANION GAP
Anion Gap: 13 mmol/L (ref 10.0–18.0)
BUN/Creatinine Ratio: 20 (ref 12–28)
BUN: 17 mg/dL (ref 8–27)
CO2: 26 mmol/L (ref 20–29)
Calcium: 9.6 mg/dL (ref 8.7–10.3)
Chloride: 103 mmol/L (ref 96–106)
Creatinine, Ser: 0.84 mg/dL (ref 0.57–1.00)
Glucose: 114 mg/dL — ABNORMAL HIGH (ref 70–99)
Potassium: 4.7 mmol/L (ref 3.5–5.2)
Sodium: 142 mmol/L (ref 134–144)
eGFR: 78 mL/min/{1.73_m2} (ref >59)

## 2023-11-28 NOTE — Progress Notes (Signed)
 [facility] Attending  Case discussed with Dr. Addie  At the time of the visit.  We reviewed the resident's history and exam and pertinent patient test results.  I agree with the assessment, diagnosis, and plan of care documented in the resident's note.   Mean glucose not used for diagnosis of diabetes, plan to recheck A1c at next visit to confirm. Natasha [name] is working on lifestyle modifications which will also help with elevated blood pressure and hyperlipidemia.

## 2023-12-16 ENCOUNTER — Ambulatory Visit: Payer: No Typology Code available for payment source

## 2024-02-16 ENCOUNTER — Telehealth: Payer: Self-pay

## 2024-02-16 NOTE — Telephone Encounter (Signed)
 Health Coaching 3/ [facility]  interpreter-  Natale Lay, [facility]   Obtained remaining BP readings from patient. Patient's blood pressure has improved since her initial screening. Patient has been working on making changes with her diet and exercise. Patient states that she has cut back on the amount of carbs that she consumes and has been eating more vegetables. Patient states that she has also been walking regularly.   Navigation:  Patient is aware of  a follow up session. Patient got medicaid back in January. Sent email to the state to see if I can see her for her FU visit.

## 2024-02-17 ENCOUNTER — Encounter: Payer: No Typology Code available for payment source | Admitting: Internal Medicine

## 2024-03-21 ENCOUNTER — Inpatient Hospital Stay: Payer: Self-pay

## 2024-09-25 DIAGNOSIS — Z6830 Body mass index (BMI) 30.0-30.9, adult: Secondary | ICD-10-CM | POA: Diagnosis not present

## 2024-09-25 DIAGNOSIS — R921 Mammographic calcification found on diagnostic imaging of breast: Secondary | ICD-10-CM | POA: Diagnosis not present

## 2024-09-25 DIAGNOSIS — Z780 Asymptomatic menopausal state: Secondary | ICD-10-CM | POA: Diagnosis not present

## 2024-09-25 DIAGNOSIS — R030 Elevated blood-pressure reading, without diagnosis of hypertension: Secondary | ICD-10-CM | POA: Diagnosis not present

## 2024-09-25 DIAGNOSIS — Z1211 Encounter for screening for malignant neoplasm of colon: Secondary | ICD-10-CM | POA: Diagnosis not present

## 2024-09-25 DIAGNOSIS — E7800 Pure hypercholesterolemia, unspecified: Secondary | ICD-10-CM | POA: Diagnosis not present

## 2024-09-25 DIAGNOSIS — Z1231 Encounter for screening mammogram for malignant neoplasm of breast: Secondary | ICD-10-CM | POA: Diagnosis not present

## 2024-09-25 DIAGNOSIS — F40243 Fear of flying: Secondary | ICD-10-CM | POA: Diagnosis not present

## 2024-10-03 DIAGNOSIS — Z1231 Encounter for screening mammogram for malignant neoplasm of breast: Secondary | ICD-10-CM | POA: Diagnosis not present
